# Patient Record
Sex: Female | Born: 1945
Health system: Southern US, Community
[De-identification: ages and names within clinical notes are randomized; demographics above are authoritative.]

## PROBLEM LIST (undated history)

## (undated) DIAGNOSIS — I471 Supraventricular tachycardia, unspecified: Secondary | ICD-10-CM

## (undated) DIAGNOSIS — K219 Gastro-esophageal reflux disease without esophagitis: Secondary | ICD-10-CM

## (undated) DIAGNOSIS — J302 Other seasonal allergic rhinitis: Secondary | ICD-10-CM

## (undated) DIAGNOSIS — R079 Chest pain, unspecified: Secondary | ICD-10-CM

## (undated) DIAGNOSIS — R002 Palpitations: Secondary | ICD-10-CM

## (undated) DIAGNOSIS — E785 Hyperlipidemia, unspecified: Secondary | ICD-10-CM

## (undated) DIAGNOSIS — I10 Essential (primary) hypertension: Secondary | ICD-10-CM

## (undated) DIAGNOSIS — E119 Type 2 diabetes mellitus without complications: Secondary | ICD-10-CM

## (undated) DIAGNOSIS — E669 Obesity, unspecified: Secondary | ICD-10-CM

## (undated) DIAGNOSIS — Z8679 Personal history of other diseases of the circulatory system: Secondary | ICD-10-CM

## (undated) DIAGNOSIS — M8580 Other specified disorders of bone density and structure, unspecified site: Secondary | ICD-10-CM

## (undated) HISTORY — DX: Hyperlipidemia, unspecified: E78.5

## (undated) HISTORY — DX: Palpitations: R00.2

## (undated) HISTORY — DX: Supraventricular tachycardia: I47.1

## (undated) HISTORY — DX: Type 2 diabetes mellitus without complications: E11.9

## (undated) HISTORY — DX: Obesity, unspecified: E66.9

## (undated) HISTORY — DX: Personal history of other diseases of the circulatory system: Z86.79

## (undated) HISTORY — DX: Supraventricular tachycardia, unspecified: I47.10

## (undated) HISTORY — DX: Essential (primary) hypertension: I10

## (undated) HISTORY — DX: Other specified disorders of bone density and structure, unspecified site: M85.80

## (undated) HISTORY — DX: Gastro-esophageal reflux disease without esophagitis: K21.9

## (undated) HISTORY — DX: Chest pain, unspecified: R07.9

---
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
## 1978-04-19 HISTORY — PX: TUBAL LIGATION: SHX77

## 1997-11-25 ENCOUNTER — Other Ambulatory Visit: Admission: RE | Admit: 1997-11-25 | Discharge: 1997-11-25 | Payer: Self-pay | Admitting: *Deleted

## 1998-12-30 ENCOUNTER — Other Ambulatory Visit: Admission: RE | Admit: 1998-12-30 | Discharge: 1998-12-30 | Payer: Self-pay | Admitting: *Deleted

## 2000-02-22 ENCOUNTER — Other Ambulatory Visit: Admission: RE | Admit: 2000-02-22 | Discharge: 2000-02-22 | Payer: Self-pay | Admitting: Obstetrics & Gynecology

## 2000-02-23 DIAGNOSIS — Z8679 Personal history of other diseases of the circulatory system: Secondary | ICD-10-CM

## 2000-02-23 HISTORY — DX: Personal history of other diseases of the circulatory system: Z86.79

## 2000-03-21 ENCOUNTER — Encounter: Admission: RE | Admit: 2000-03-21 | Discharge: 2000-06-19 | Payer: Self-pay | Admitting: Pulmonary Disease

## 2000-11-16 ENCOUNTER — Emergency Department (HOSPITAL_COMMUNITY): Admission: EM | Admit: 2000-11-16 | Discharge: 2000-11-16 | Payer: Self-pay | Admitting: Emergency Medicine

## 2001-01-30 ENCOUNTER — Emergency Department (HOSPITAL_COMMUNITY): Admission: EM | Admit: 2001-01-30 | Discharge: 2001-01-30 | Payer: Self-pay | Admitting: Emergency Medicine

## 2001-02-24 ENCOUNTER — Other Ambulatory Visit: Admission: RE | Admit: 2001-02-24 | Discharge: 2001-02-24 | Payer: Self-pay | Admitting: Obstetrics & Gynecology

## 2001-04-19 HISTORY — PX: CATARACT EXTRACTION: SUR2

## 2001-08-12 ENCOUNTER — Emergency Department (HOSPITAL_COMMUNITY): Admission: EM | Admit: 2001-08-12 | Discharge: 2001-08-13 | Payer: Self-pay | Admitting: *Deleted

## 2002-02-28 ENCOUNTER — Other Ambulatory Visit: Admission: RE | Admit: 2002-02-28 | Discharge: 2002-02-28 | Payer: Self-pay | Admitting: Obstetrics & Gynecology

## 2002-11-06 ENCOUNTER — Encounter: Payer: Self-pay | Admitting: Emergency Medicine

## 2002-11-06 ENCOUNTER — Emergency Department (HOSPITAL_COMMUNITY): Admission: EM | Admit: 2002-11-06 | Discharge: 2002-11-06 | Payer: Self-pay | Admitting: Emergency Medicine

## 2003-02-06 ENCOUNTER — Emergency Department (HOSPITAL_COMMUNITY): Admission: EM | Admit: 2003-02-06 | Discharge: 2003-02-06 | Payer: Self-pay | Admitting: Emergency Medicine

## 2003-02-06 ENCOUNTER — Encounter: Payer: Self-pay | Admitting: Emergency Medicine

## 2003-03-04 ENCOUNTER — Other Ambulatory Visit: Admission: RE | Admit: 2003-03-04 | Discharge: 2003-03-04 | Payer: Self-pay | Admitting: Obstetrics & Gynecology

## 2003-08-12 ENCOUNTER — Emergency Department (HOSPITAL_COMMUNITY): Admission: EM | Admit: 2003-08-12 | Discharge: 2003-08-12 | Payer: Self-pay | Admitting: *Deleted

## 2003-08-28 ENCOUNTER — Ambulatory Visit (HOSPITAL_COMMUNITY): Admission: RE | Admit: 2003-08-28 | Discharge: 2003-08-28 | Payer: Self-pay | Admitting: Urology

## 2004-10-01 ENCOUNTER — Encounter (INDEPENDENT_AMBULATORY_CARE_PROVIDER_SITE_OTHER): Payer: Self-pay | Admitting: Internal Medicine

## 2004-12-05 ENCOUNTER — Emergency Department (HOSPITAL_COMMUNITY): Admission: EM | Admit: 2004-12-05 | Discharge: 2004-12-05 | Payer: Self-pay | Admitting: *Deleted

## 2005-05-20 ENCOUNTER — Encounter (INDEPENDENT_AMBULATORY_CARE_PROVIDER_SITE_OTHER): Payer: Self-pay | Admitting: Internal Medicine

## 2005-05-20 LAB — CONVERTED CEMR LAB: Pap Smear: NORMAL

## 2005-09-14 ENCOUNTER — Ambulatory Visit: Payer: Self-pay | Admitting: Internal Medicine

## 2005-09-15 ENCOUNTER — Ambulatory Visit (HOSPITAL_COMMUNITY): Admission: RE | Admit: 2005-09-15 | Discharge: 2005-09-15 | Payer: Self-pay | Admitting: Internal Medicine

## 2005-09-15 ENCOUNTER — Encounter (INDEPENDENT_AMBULATORY_CARE_PROVIDER_SITE_OTHER): Payer: Self-pay | Admitting: Internal Medicine

## 2005-09-23 ENCOUNTER — Ambulatory Visit: Payer: Self-pay | Admitting: Internal Medicine

## 2005-10-05 ENCOUNTER — Encounter (INDEPENDENT_AMBULATORY_CARE_PROVIDER_SITE_OTHER): Payer: Self-pay | Admitting: Internal Medicine

## 2005-10-11 ENCOUNTER — Ambulatory Visit: Payer: Self-pay | Admitting: Internal Medicine

## 2005-10-12 ENCOUNTER — Encounter (INDEPENDENT_AMBULATORY_CARE_PROVIDER_SITE_OTHER): Payer: Self-pay | Admitting: Internal Medicine

## 2005-11-12 ENCOUNTER — Encounter (INDEPENDENT_AMBULATORY_CARE_PROVIDER_SITE_OTHER): Payer: Self-pay | Admitting: Internal Medicine

## 2006-01-03 ENCOUNTER — Ambulatory Visit: Payer: Self-pay | Admitting: Internal Medicine

## 2006-04-04 ENCOUNTER — Encounter (INDEPENDENT_AMBULATORY_CARE_PROVIDER_SITE_OTHER): Payer: Self-pay | Admitting: Internal Medicine

## 2006-04-05 ENCOUNTER — Encounter (INDEPENDENT_AMBULATORY_CARE_PROVIDER_SITE_OTHER): Payer: Self-pay | Admitting: Internal Medicine

## 2006-04-23 ENCOUNTER — Encounter: Payer: Self-pay | Admitting: Internal Medicine

## 2006-04-23 DIAGNOSIS — F329 Major depressive disorder, single episode, unspecified: Secondary | ICD-10-CM

## 2006-04-23 DIAGNOSIS — H269 Unspecified cataract: Secondary | ICD-10-CM

## 2006-04-23 DIAGNOSIS — Z8679 Personal history of other diseases of the circulatory system: Secondary | ICD-10-CM | POA: Insufficient documentation

## 2006-04-23 DIAGNOSIS — K5900 Constipation, unspecified: Secondary | ICD-10-CM | POA: Insufficient documentation

## 2006-04-23 DIAGNOSIS — K219 Gastro-esophageal reflux disease without esophagitis: Secondary | ICD-10-CM

## 2006-04-23 DIAGNOSIS — D649 Anemia, unspecified: Secondary | ICD-10-CM

## 2006-04-23 DIAGNOSIS — E782 Mixed hyperlipidemia: Secondary | ICD-10-CM

## 2006-04-23 DIAGNOSIS — M1990 Unspecified osteoarthritis, unspecified site: Secondary | ICD-10-CM | POA: Insufficient documentation

## 2006-04-23 DIAGNOSIS — E041 Nontoxic single thyroid nodule: Secondary | ICD-10-CM | POA: Insufficient documentation

## 2006-04-23 DIAGNOSIS — F3289 Other specified depressive episodes: Secondary | ICD-10-CM | POA: Insufficient documentation

## 2006-05-16 ENCOUNTER — Encounter (INDEPENDENT_AMBULATORY_CARE_PROVIDER_SITE_OTHER): Payer: Self-pay | Admitting: Internal Medicine

## 2006-05-17 ENCOUNTER — Telehealth (INDEPENDENT_AMBULATORY_CARE_PROVIDER_SITE_OTHER): Payer: Self-pay | Admitting: Internal Medicine

## 2006-05-18 ENCOUNTER — Encounter (INDEPENDENT_AMBULATORY_CARE_PROVIDER_SITE_OTHER): Payer: Self-pay | Admitting: Internal Medicine

## 2006-05-19 ENCOUNTER — Ambulatory Visit: Payer: Self-pay | Admitting: Internal Medicine

## 2006-05-20 LAB — CONVERTED CEMR LAB: Pap Smear: NORMAL

## 2006-05-27 ENCOUNTER — Ambulatory Visit: Payer: Self-pay | Admitting: Internal Medicine

## 2006-05-27 DIAGNOSIS — J019 Acute sinusitis, unspecified: Secondary | ICD-10-CM

## 2006-09-07 ENCOUNTER — Encounter (INDEPENDENT_AMBULATORY_CARE_PROVIDER_SITE_OTHER): Payer: Self-pay | Admitting: Internal Medicine

## 2006-09-08 ENCOUNTER — Telehealth (INDEPENDENT_AMBULATORY_CARE_PROVIDER_SITE_OTHER): Payer: Self-pay | Admitting: Internal Medicine

## 2006-09-09 ENCOUNTER — Encounter (INDEPENDENT_AMBULATORY_CARE_PROVIDER_SITE_OTHER): Payer: Self-pay | Admitting: Internal Medicine

## 2006-09-20 ENCOUNTER — Encounter (INDEPENDENT_AMBULATORY_CARE_PROVIDER_SITE_OTHER): Payer: Self-pay | Admitting: Internal Medicine

## 2006-09-22 ENCOUNTER — Encounter (INDEPENDENT_AMBULATORY_CARE_PROVIDER_SITE_OTHER): Payer: Self-pay | Admitting: Internal Medicine

## 2006-09-22 LAB — CONVERTED CEMR LAB
ALT: 20 units/L
Alkaline Phosphatase: 75 units/L
CO2: 25 meq/L
Calcium: 9.6 mg/dL
Chloride: 105 meq/L
Cholesterol: 135 mg/dL
HDL: 59 mg/dL
Potassium: 5.2 meq/L
Total Bilirubin: 0.5 mg/dL
Total CHOL/HDL Ratio: 2.3
Triglycerides: 155 mg/dL
VLDL: 31 mg/dL

## 2006-09-26 ENCOUNTER — Ambulatory Visit: Payer: Self-pay | Admitting: Internal Medicine

## 2006-09-26 DIAGNOSIS — I10 Essential (primary) hypertension: Secondary | ICD-10-CM

## 2006-09-26 LAB — CONVERTED CEMR LAB: Hgb A1c MFr Bld: 7.3 %

## 2006-09-27 ENCOUNTER — Encounter (INDEPENDENT_AMBULATORY_CARE_PROVIDER_SITE_OTHER): Payer: Self-pay | Admitting: Internal Medicine

## 2006-10-17 ENCOUNTER — Encounter (INDEPENDENT_AMBULATORY_CARE_PROVIDER_SITE_OTHER): Payer: Self-pay | Admitting: Internal Medicine

## 2006-12-14 ENCOUNTER — Telehealth (INDEPENDENT_AMBULATORY_CARE_PROVIDER_SITE_OTHER): Payer: Self-pay | Admitting: *Deleted

## 2006-12-26 ENCOUNTER — Encounter (INDEPENDENT_AMBULATORY_CARE_PROVIDER_SITE_OTHER): Payer: Self-pay | Admitting: Internal Medicine

## 2006-12-26 ENCOUNTER — Ambulatory Visit: Payer: Self-pay | Admitting: Family Medicine

## 2006-12-26 DIAGNOSIS — R0989 Other specified symptoms and signs involving the circulatory and respiratory systems: Secondary | ICD-10-CM

## 2006-12-26 LAB — CONVERTED CEMR LAB
Glucose, 2 hr postprandial: 162 mg/dL
Hgb A1c MFr Bld: 7.1 %

## 2007-02-14 ENCOUNTER — Ambulatory Visit: Payer: Self-pay | Admitting: Internal Medicine

## 2007-03-30 ENCOUNTER — Ambulatory Visit: Payer: Self-pay | Admitting: Internal Medicine

## 2007-03-30 LAB — CONVERTED CEMR LAB: Blood Glucose, Fingerstick: 277

## 2007-04-26 ENCOUNTER — Encounter (INDEPENDENT_AMBULATORY_CARE_PROVIDER_SITE_OTHER): Payer: Self-pay | Admitting: Internal Medicine

## 2007-05-22 LAB — CONVERTED CEMR LAB: Pap Smear: NORMAL

## 2007-06-23 ENCOUNTER — Encounter (INDEPENDENT_AMBULATORY_CARE_PROVIDER_SITE_OTHER): Payer: Self-pay | Admitting: Internal Medicine

## 2007-06-27 LAB — CONVERTED CEMR LAB
BUN: 22 mg/dL (ref 6–23)
Chloride: 102 meq/L (ref 96–112)
Cholesterol: 187 mg/dL (ref 0–200)
Glucose, Bld: 176 mg/dL — ABNORMAL HIGH (ref 70–99)
HDL: 67 mg/dL (ref >39)
Total CHOL/HDL Ratio: 2.8
Total Protein: 7.2 g/dL (ref 6.0–8.3)
Triglycerides: 241 mg/dL — ABNORMAL HIGH (ref <150)
VLDL: 48 mg/dL — ABNORMAL HIGH (ref 0–40)

## 2007-07-06 ENCOUNTER — Ambulatory Visit: Payer: Self-pay | Admitting: Internal Medicine

## 2007-07-06 DIAGNOSIS — L608 Other nail disorders: Secondary | ICD-10-CM | POA: Insufficient documentation

## 2007-07-06 DIAGNOSIS — E559 Vitamin D deficiency, unspecified: Secondary | ICD-10-CM | POA: Insufficient documentation

## 2007-07-06 LAB — CONVERTED CEMR LAB
Blood Glucose, Fingerstick: 333
Hgb A1c MFr Bld: 7.8 %

## 2007-07-07 ENCOUNTER — Telehealth (INDEPENDENT_AMBULATORY_CARE_PROVIDER_SITE_OTHER): Payer: Self-pay | Admitting: *Deleted

## 2007-07-07 ENCOUNTER — Encounter (INDEPENDENT_AMBULATORY_CARE_PROVIDER_SITE_OTHER): Payer: Self-pay | Admitting: Internal Medicine

## 2007-07-07 LAB — CONVERTED CEMR LAB
Creatinine, Urine: 74.1 mg/dL
Microalb, Ur: 0.56 mg/dL (ref 0.00–1.89)

## 2007-08-08 ENCOUNTER — Telehealth (INDEPENDENT_AMBULATORY_CARE_PROVIDER_SITE_OTHER): Payer: Self-pay | Admitting: *Deleted

## 2007-08-14 ENCOUNTER — Telehealth (INDEPENDENT_AMBULATORY_CARE_PROVIDER_SITE_OTHER): Payer: Self-pay | Admitting: Internal Medicine

## 2007-08-14 ENCOUNTER — Encounter (INDEPENDENT_AMBULATORY_CARE_PROVIDER_SITE_OTHER): Payer: Self-pay | Admitting: Internal Medicine

## 2007-09-20 ENCOUNTER — Encounter (INDEPENDENT_AMBULATORY_CARE_PROVIDER_SITE_OTHER): Payer: Self-pay | Admitting: Internal Medicine

## 2007-10-06 ENCOUNTER — Ambulatory Visit: Payer: Self-pay | Admitting: Internal Medicine

## 2007-10-06 DIAGNOSIS — E669 Obesity, unspecified: Secondary | ICD-10-CM

## 2008-01-05 ENCOUNTER — Ambulatory Visit: Payer: Self-pay | Admitting: Internal Medicine

## 2008-01-05 DIAGNOSIS — E669 Obesity, unspecified: Secondary | ICD-10-CM

## 2008-01-05 DIAGNOSIS — E1169 Type 2 diabetes mellitus with other specified complication: Secondary | ICD-10-CM | POA: Insufficient documentation

## 2008-02-02 ENCOUNTER — Ambulatory Visit: Payer: Self-pay | Admitting: Internal Medicine

## 2008-03-11 ENCOUNTER — Encounter (INDEPENDENT_AMBULATORY_CARE_PROVIDER_SITE_OTHER): Payer: Self-pay | Admitting: Internal Medicine

## 2008-03-12 ENCOUNTER — Encounter (INDEPENDENT_AMBULATORY_CARE_PROVIDER_SITE_OTHER): Payer: Self-pay | Admitting: Internal Medicine

## 2008-03-13 LAB — CONVERTED CEMR LAB
Albumin: 4.3 g/dL (ref 3.5–5.2)
Basophils Absolute: 0.1 10*3/uL (ref 0.0–0.1)
Basophils Relative: 1 % (ref 0–1)
Calcium: 9.7 mg/dL (ref 8.4–10.5)
Chloride: 104 meq/L (ref 96–112)
Cholesterol: 189 mg/dL (ref 0–200)
Creatinine, Ser: 0.75 mg/dL (ref 0.40–1.20)
Eosinophils Absolute: 0.2 10*3/uL (ref 0.0–0.7)
Eosinophils Relative: 4 % (ref 0–5)
Glucose, Bld: 142 mg/dL — ABNORMAL HIGH (ref 70–99)
Lymphocytes Relative: 44 % (ref 12–46)
MCHC: 30.7 g/dL (ref 30.0–36.0)
MCV: 92.8 fL (ref 78.0–100.0)
Monocytes Relative: 8 % (ref 3–12)
Potassium: 4.7 meq/L (ref 3.5–5.3)
Sodium: 141 meq/L (ref 135–145)
TSH: 2.072 microintl units/mL (ref 0.350–4.50)
Total Bilirubin: 0.5 mg/dL (ref 0.3–1.2)
VLDL: 36 mg/dL (ref 0–40)
Vit D, 1,25-Dihydroxy: 39 (ref 30–89)

## 2008-04-04 ENCOUNTER — Ambulatory Visit: Payer: Self-pay | Admitting: Internal Medicine

## 2008-04-23 ENCOUNTER — Encounter (INDEPENDENT_AMBULATORY_CARE_PROVIDER_SITE_OTHER): Payer: Self-pay | Admitting: Internal Medicine

## 2008-05-02 ENCOUNTER — Encounter (INDEPENDENT_AMBULATORY_CARE_PROVIDER_SITE_OTHER): Payer: Self-pay | Admitting: Internal Medicine

## 2008-11-22 ENCOUNTER — Ambulatory Visit: Payer: Self-pay | Admitting: Internal Medicine

## 2008-11-22 DIAGNOSIS — R5381 Other malaise: Secondary | ICD-10-CM

## 2008-11-22 DIAGNOSIS — R5383 Other fatigue: Secondary | ICD-10-CM

## 2008-11-22 LAB — CONVERTED CEMR LAB
Creatinine, Urine: 276.7 mg/dL
Hgb A1c MFr Bld: 7.4 %
Microalb Creat Ratio: 30.1 mg/g — ABNORMAL HIGH (ref 0.0–30.0)

## 2008-12-06 ENCOUNTER — Ambulatory Visit: Payer: Self-pay | Admitting: Internal Medicine

## 2008-12-13 ENCOUNTER — Encounter (INDEPENDENT_AMBULATORY_CARE_PROVIDER_SITE_OTHER): Payer: Self-pay | Admitting: Internal Medicine

## 2008-12-17 ENCOUNTER — Encounter (INDEPENDENT_AMBULATORY_CARE_PROVIDER_SITE_OTHER): Payer: Self-pay | Admitting: Internal Medicine

## 2008-12-26 ENCOUNTER — Encounter (INDEPENDENT_AMBULATORY_CARE_PROVIDER_SITE_OTHER): Payer: Self-pay | Admitting: Internal Medicine

## 2009-01-01 ENCOUNTER — Ambulatory Visit: Payer: Self-pay | Admitting: Internal Medicine

## 2010-05-17 LAB — CONVERTED CEMR LAB
Basophils Absolute: 0 10*3/uL
CO2: 20 meq/L
Calcium: 9.8 mg/dL
Chloride: 106 meq/L
Cholesterol: 165 mg/dL
Eosinophils Relative: 4 %
HCT: 39.5 %
Hemoglobin: 12.6 g/dL
LDL Cholesterol: 72 mg/dL
MCHC: 31.9 g/dL
MCV: 92.3 fL
Microalb Creat Ratio: 7.1 mg/g
Microalb, Ur: 0.73 mg/dL
Potassium: 4.4 meq/L
RBC count: 4.28 10*6/uL
Sodium: 138 meq/L
TSH: 2.118 microintl units/mL
Total CHOL/HDL Ratio: 2.8
Triglycerides: 173 mg/dL

## 2010-08-19 ENCOUNTER — Emergency Department (HOSPITAL_COMMUNITY)
Admission: EM | Admit: 2010-08-19 | Discharge: 2010-08-19 | Disposition: A | Discharge status: Home / Self Care | Payer: BC Managed Care – PPO | Attending: Emergency Medicine | Admitting: Emergency Medicine

## 2010-08-19 ENCOUNTER — Emergency Department (HOSPITAL_COMMUNITY): Payer: BC Managed Care – PPO

## 2010-08-19 ENCOUNTER — Inpatient Hospital Stay (HOSPITAL_COMMUNITY): Admit: 2010-08-19 | Payer: Self-pay | Source: Other Acute Inpatient Hospital | Admitting: Cardiovascular Disease

## 2010-08-19 DIAGNOSIS — R079 Chest pain, unspecified: Secondary | ICD-10-CM | POA: Insufficient documentation

## 2010-08-19 DIAGNOSIS — I10 Essential (primary) hypertension: Secondary | ICD-10-CM | POA: Insufficient documentation

## 2010-08-19 DIAGNOSIS — Z7982 Long term (current) use of aspirin: Secondary | ICD-10-CM | POA: Insufficient documentation

## 2010-08-19 DIAGNOSIS — Z79899 Other long term (current) drug therapy: Secondary | ICD-10-CM | POA: Insufficient documentation

## 2010-08-19 DIAGNOSIS — E119 Type 2 diabetes mellitus without complications: Secondary | ICD-10-CM | POA: Insufficient documentation

## 2010-08-19 LAB — BASIC METABOLIC PANEL
Calcium: 10.1 mg/dL (ref 8.4–10.5)
GFR calc Af Amer: >60 mL/min (ref >60)
GFR calc non Af Amer: >60 mL/min (ref >60)

## 2010-08-19 LAB — CBC
HCT: 37.8 % (ref 36.0–46.0)
Hemoglobin: 12.3 g/dL (ref 12.0–15.0)
MCH: 29.6 pg (ref 26.0–34.0)
MCV: 90.9 fL (ref 78.0–100.0)
Platelets: 255 10*3/uL (ref 150–400)
RDW: 13.1 % (ref 11.5–15.5)
WBC: 7.8 10*3/uL (ref 4.0–10.5)

## 2010-08-19 LAB — DIFFERENTIAL
Basophils Relative: 1 % (ref 0–1)
Lymphs Abs: 3.8 10*3/uL (ref 0.7–4.0)
Monocytes Absolute: 0.5 10*3/uL (ref 0.1–1.0)
Monocytes Relative: 6 % (ref 3–12)
Neutro Abs: 3.2 10*3/uL (ref 1.7–7.7)
Neutrophils Relative %: 41 % — ABNORMAL LOW (ref 43–77)

## 2010-08-19 LAB — POCT CARDIAC MARKERS
Myoglobin, poc: 36.9 ng/mL (ref 12–200)
Troponin i, poc: <0.05 ng/mL (ref 0.00–0.09)

## 2010-09-16 DIAGNOSIS — R079 Chest pain, unspecified: Secondary | ICD-10-CM

## 2010-09-16 HISTORY — DX: Chest pain, unspecified: R07.9

## 2012-02-17 ENCOUNTER — Other Ambulatory Visit (HOSPITAL_COMMUNITY): Payer: BC Managed Care – PPO

## 2012-02-24 ENCOUNTER — Ambulatory Visit: Admit: 2012-02-24 | Payer: Self-pay | Admitting: Ophthalmology

## 2012-02-24 SURGERY — PHACOEMULSIFICATION, CATARACT, WITH IOL INSERTION
Anesthesia: Monitor Anesthesia Care | Site: Eye | Laterality: Left

## 2012-07-12 ENCOUNTER — Encounter: Payer: Self-pay | Admitting: *Deleted

## 2012-08-26 ENCOUNTER — Encounter: Payer: Self-pay | Admitting: Cardiovascular Disease

## 2012-08-30 ENCOUNTER — Encounter: Payer: Self-pay | Admitting: Cardiovascular Disease

## 2012-08-30 ENCOUNTER — Ambulatory Visit (INDEPENDENT_AMBULATORY_CARE_PROVIDER_SITE_OTHER): Payer: Medicare Other | Admitting: Cardiovascular Disease

## 2012-08-30 VITALS — BP 110/68 | HR 65 | Resp 16 | Ht 63.0 in | Wt 198.0 lb

## 2012-08-30 DIAGNOSIS — I10 Essential (primary) hypertension: Secondary | ICD-10-CM

## 2012-08-30 DIAGNOSIS — E669 Obesity, unspecified: Secondary | ICD-10-CM

## 2012-08-30 DIAGNOSIS — E785 Hyperlipidemia, unspecified: Secondary | ICD-10-CM

## 2012-08-30 DIAGNOSIS — Z8679 Personal history of other diseases of the circulatory system: Secondary | ICD-10-CM

## 2012-08-30 DIAGNOSIS — E119 Type 2 diabetes mellitus without complications: Secondary | ICD-10-CM

## 2012-08-30 NOTE — Progress Notes (Signed)
Patient ID: Brenda Hatfield, female   DOB: 1945-12-02, 67 y.o.   MRN: [id number]   HPI  Brenda Hatfield is a 67 year old woman with a history of obesity diabetes mellitus type 2 systemic hypertension hyperlipidemia who returns for followup. Previous workup including echocardiography and stress testing has not shown any evidence of structural heart disease. She underwent an echo perfusion scan in 2012 that was normal.  She has made a serious efforts at weight loss and improve her diet and has managed to lose 18 pounds in weight. She is now back to the weight that she had been 2012.  She has not yet really started a program of rigorous physical activity because the winter was so cold, but plans to do so  No Known Allergies  Current Outpatient Prescriptions  Medication Sig Dispense Refill  . aspirin 81 MG tablet Take 81 mg by mouth daily.      . calcium carbonate (OS-CAL) 600 MG TABS Take 600 mg by mouth daily.      [name] diltiazem (CARDIZEM CD) 180 MG 24 hr capsule Take 180 mg by mouth daily.      . insulin aspart (NOVOLOG) 100 UNIT/ML injection Inject into the skin 3 (three) times daily before meals. FlexPen per Sliding Scale      . insulin glargine (LANTUS) 100 UNIT/ML injection Inject 44 Units into the skin at bedtime.      [name] lisinopril (PRINIVIL,ZESTRIL) 10 MG tablet Take 10 mg by mouth daily.      . metFORMIN (GLUCOPHAGE) 1000 MG tablet Take 1,000 mg by mouth 2 (two) times daily with a meal.      . metoprolol succinate (TOPROL-XL) 25 MG 24 hr tablet Take 25 mg by mouth daily.      [name] omeprazole (PRILOSEC) 20 MG capsule Take 20 mg by mouth daily.      . rosuvastatin (CRESTOR) 20 MG tablet Take 20 mg by mouth daily.       No current facility-administered medications for this visit.    Past Medical History  Diagnosis Date  . Obesity   . Diabetes mellitus     type 2  . Systemic hypertension   . Hyperlipemia   . Chest pain 09/16/2010    normal myocardial perfusion study EF=61%  . History of  cardiac murmur 02/23/00    2D Echo EF greater than 55%  . SVT (supraventricular tachycardia)   . Palpitation   . GERD (gastroesophageal reflux disease)   . CAD (coronary artery disease)     Past Surgical History  Procedure Laterality Date  . Tonsillectomy  1957  . Tubal ligation  1980    Family History  Problem Relation Age of Onset  . Parkinson's disease Father   . Diabetes Father     History   Social History  . Marital Status: Married    Spouse Name: N/A    Number of Children: N/A  . Years of Education: N/A   Occupational History  . Not on file.   Social History Main Topics  . Smoking status: Never Smoker   . Smokeless tobacco: Not on file  . Alcohol Use: No  . Drug Use: No  . Sexually Active: Not on file   Other Topics Concern  . Not on file   Social History Narrative  . No narrative on file    ROS: ,The patient specifically denies any chest pain at rest exertion, dyspnea at rest or with exertion, orthopnea, paroxysmal nocturnal dyspnea, syncope, palpitations, focal neurological deficits,  intermittent claudication, lower showed edema, unexplained weight gain, cough, hemoptysis or wheezing.   PHYSICAL EXAM BP 110/68  Pulse 65  Resp 16  Ht 5\' 3"  (1.6 m)  Wt 198 lb (89.812 kg)  BMI 35.08 kg/m2  General: Alert, oriented x3, no distress Head: no evidence of trauma, PERRL, EOMI, no exophtalmos or lid lag, no myxedema, no xanthelasma; normal ears, nose and oropharynx Neck: normal jugular venous pulsations and no hepatojugular reflux; brisk carotid pulses without delay and no carotid bruits Chest: clear to auscultation, no signs of consolidation by percussion or palpation, normal fremitus, symmetrical and full respiratory excursions Cardiovascular: normal position and quality of the apical impulse, regular rhythm, normal first and second heart sounds, no murmurs, rubs or gallops Abdomen: no tenderness or distention, no masses by palpation, no abnormal  pulsatility or arterial bruits, normal bowel sounds, no hepatosplenomegaly Extremities: no clubbing, cyanosis or edema; 2+ radial, ulnar and brachial pulses bilaterally; 2+ right femoral, posterior tibial and dorsalis pedis pulses; 2+ left femoral, posterior tibial and dorsalis pedis pulsesno subclavian or femoral bruits Neurological: grossly nonfocal   EKG: normal sinus rhythm., normal.  Her most recent hemoglobin A1c was reportedly 7.8% and her triglycerides were still little high but her cholesterol and LDL cholesterol were within the desirable range.  ASSESSMENT AND PLAN:  Mrs. Joost has actually made considerable progress with weight loss. This is reflected in improved diabetes control and once appropriate glycemic control is achieved I think her triglycerides will also be in the desirable range. We discussed the fact that physical activity is a necessary part of her metabolic improvement and will greatly assist with weight loss. She has noticed that she has reached a "plateau" and her weight loss attempts.  All in all I congratulated Mrs. Garfield for a lot of progress made within the last 12 months. No changes are recommended to her current medications.  Cc: Catalina Pizza, MD  [place] Fair

## 2013-04-03 ENCOUNTER — Encounter (HOSPITAL_COMMUNITY): Payer: Self-pay | Admitting: Pharmacy Technician

## 2013-04-04 ENCOUNTER — Encounter (HOSPITAL_COMMUNITY)
Admission: RE | Admit: 2013-04-04 | Discharge: 2013-04-04 | Disposition: A | Discharge status: Home / Self Care | Payer: Medicare Other | Source: Ambulatory Visit | Attending: Ophthalmology | Admitting: Ophthalmology

## 2013-04-04 ENCOUNTER — Encounter (HOSPITAL_COMMUNITY): Payer: Self-pay

## 2013-04-04 DIAGNOSIS — Z01812 Encounter for preprocedural laboratory examination: Secondary | ICD-10-CM | POA: Diagnosis present

## 2013-04-04 LAB — BASIC METABOLIC PANEL
CO2: 26 mEq/L (ref 19–32)
Calcium: 9.9 mg/dL (ref 8.4–10.5)
GFR calc non Af Amer: 86 mL/min — ABNORMAL LOW (ref >90)
Sodium: 139 mEq/L (ref 135–145)

## 2013-04-04 LAB — HEMOGLOBIN AND HEMATOCRIT, BLOOD: HCT: 41.9 % (ref 36.0–46.0)

## 2013-04-04 NOTE — Patient Instructions (Addendum)
Your procedure is scheduled on: 04/09/2013  Report to [facility] at  900 AM    Call this number if you have problems the morning of surgery: [phone]   Do not eat food or drink liquids :After Midnight.      Take these medicines the morning of surgery with A SIP OF WATER: lisinopril, metoprolol, prilosec, diltiazem   Do not wear jewelry, make-up or nail polish.  Do not wear lotions, powders, or perfumes.   Do not shave 48 hours prior to surgery.  Do not bring valuables to the hospital.  Contacts, dentures or bridgework may not be worn into surgery.  Leave suitcase in the car. After surgery it may be brought to your room.  For patients admitted to the hospital, checkout time is 11:00 AM the day of discharge.   Patients discharged the day of surgery will not be allowed to drive home.  :     Please read over the following fact sheets that you were given: Coughing and Deep Breathing, Surgical Site Infection Prevention, Anesthesia Post-op Instructions and Care and Recovery After Surgery    Cataract A cataract is a clouding of the lens of the eye. When a lens becomes cloudy, vision is reduced based on the degree and nature of the clouding. Many cataracts reduce vision to some degree. Some cataracts make people more near-sighted as they develop. Other cataracts increase glare. Cataracts that are ignored and become worse can sometimes look white. The white color can be seen through the pupil. CAUSES   Aging. However, cataracts may occur at any age, even in newborns.   Certain drugs.   Trauma to the eye.   Certain diseases such as diabetes.   Specific eye diseases such as chronic inflammation inside the eye or a sudden attack of a rare form of glaucoma.   Inherited or acquired medical problems.  SYMPTOMS   Gradual, progressive drop in vision in the affected eye.   Severe, rapid visual loss. This most often happens when trauma is the cause.  DIAGNOSIS  To detect a cataract, an eye  doctor examines the lens. Cataracts are best diagnosed with an exam of the eyes with the pupils enlarged (dilated) by drops.  TREATMENT  For an early cataract, vision may improve by using different eyeglasses or stronger lighting. If that does not help your vision, surgery is the only effective treatment. A cataract needs to be surgically removed when vision loss interferes with your everyday activities, such as driving, reading, or watching TV. A cataract may also have to be removed if it prevents examination or treatment of another eye problem. Surgery removes the cloudy lens and usually replaces it with a substitute lens (intraocular lens, IOL).  At a time when both you and your doctor agree, the cataract will be surgically removed. If you have cataracts in both eyes, only one is usually removed at a time. This allows the operated eye to heal and be out of danger from any possible problems after surgery (such as infection or poor wound healing). In rare cases, a cataract may be doing damage to your eye. In these cases, your caregiver may advise surgical removal right away. The vast majority of people who have cataract surgery have better vision afterward. HOME CARE INSTRUCTIONS  If you are not planning surgery, you may be asked to do the following:  Use different eyeglasses.   Use stronger or brighter lighting.   Ask your eye doctor about reducing your medicine dose  or changing medicines if it is thought that a medicine caused your cataract. Changing medicines does not make the cataract go away on its own.   Become familiar with your surroundings. Poor vision can lead to injury. Avoid bumping into things on the affected side. You are at a higher risk for tripping or falling.   Exercise extreme care when driving or operating machinery.   Wear sunglasses if you are sensitive to bright light or experiencing problems with glare.  SEEK IMMEDIATE MEDICAL CARE IF:   You have a worsening or sudden  vision loss.   You notice redness, swelling, or increasing pain in the eye.   You have a fever.  Document Released: 04/05/2005 Document Revised: 03/25/2011 Document Reviewed: 11/27/2010 [facility] Patient Information 2012 [place].PATIENT INSTRUCTIONS POST-ANESTHESIA  IMMEDIATELY FOLLOWING SURGERY:  Do not drive or operate machinery for the first twenty four hours after surgery.  Do not make any important decisions for twenty four hours after surgery or while taking narcotic pain medications or sedatives.  If you develop intractable nausea and vomiting or a severe headache please notify your doctor immediately.  FOLLOW-UP:  Please make an appointment with your surgeon as instructed. You do not need to follow up with anesthesia unless specifically instructed to do so.  WOUND CARE INSTRUCTIONS (if applicable):  Keep a dry clean dressing on the anesthesia/puncture wound site if there is drainage.  Once the wound has quit draining you may leave it open to air.  Generally you should leave the bandage intact for twenty four hours unless there is drainage.  If the epidural site drains for more than 36-48 hours please call the anesthesia department.  QUESTIONS?:  Please feel free to call your physician or the hospital operator if you have any questions, and they will be happy to assist you.

## 2013-04-09 ENCOUNTER — Encounter (HOSPITAL_COMMUNITY)
Admission: RE | Disposition: A | Discharge status: Home / Self Care | Payer: Self-pay | Source: Ambulatory Visit | Attending: Ophthalmology

## 2013-04-09 ENCOUNTER — Encounter (HOSPITAL_COMMUNITY): Payer: Self-pay | Admitting: Ophthalmology

## 2013-04-09 ENCOUNTER — Encounter (HOSPITAL_COMMUNITY): Payer: Medicare Other | Admitting: Anesthesiology

## 2013-04-09 ENCOUNTER — Ambulatory Visit (HOSPITAL_COMMUNITY): Payer: Medicare Other | Admitting: Anesthesiology

## 2013-04-09 ENCOUNTER — Ambulatory Visit (HOSPITAL_COMMUNITY)
Admission: RE | Admit: 2013-04-09 | Discharge: 2013-04-09 | Disposition: A | Discharge status: Home / Self Care | Payer: Medicare Other | Source: Ambulatory Visit | Attending: Ophthalmology | Admitting: Ophthalmology

## 2013-04-09 DIAGNOSIS — Z79899 Other long term (current) drug therapy: Secondary | ICD-10-CM | POA: Insufficient documentation

## 2013-04-09 DIAGNOSIS — Z794 Long term (current) use of insulin: Secondary | ICD-10-CM | POA: Insufficient documentation

## 2013-04-09 DIAGNOSIS — Z01812 Encounter for preprocedural laboratory examination: Secondary | ICD-10-CM | POA: Insufficient documentation

## 2013-04-09 DIAGNOSIS — I10 Essential (primary) hypertension: Secondary | ICD-10-CM | POA: Insufficient documentation

## 2013-04-09 DIAGNOSIS — H2589 Other age-related cataract: Secondary | ICD-10-CM | POA: Insufficient documentation

## 2013-04-09 DIAGNOSIS — E119 Type 2 diabetes mellitus without complications: Secondary | ICD-10-CM | POA: Insufficient documentation

## 2013-04-09 HISTORY — PX: CATARACT EXTRACTION W/PHACO: SHX586

## 2013-04-09 LAB — GLUCOSE, CAPILLARY: Glucose-Capillary: 88 mg/dL (ref 70–99)

## 2013-04-09 SURGERY — PHACOEMULSIFICATION, CATARACT, WITH IOL INSERTION
Anesthesia: Monitor Anesthesia Care | Site: Eye | Laterality: Left

## 2013-04-09 MED ORDER — FENTANYL CITRATE 0.05 MG/ML IJ SOLN
25.0000 ug | INTRAMUSCULAR | Status: AC
  Administered 2013-04-09: 25 ug via INTRAVENOUS

## 2013-04-09 MED ORDER — EPINEPHRINE HCL 1 MG/ML IJ SOLN
INTRAOCULAR | Status: DC | PRN
  Administered 2013-04-09: 10:00:00

## 2013-04-09 MED ORDER — LACTATED RINGERS IV SOLN
INTRAVENOUS | Status: DC
  Administered 2013-04-09: 10:00:00 via INTRAVENOUS

## 2013-04-09 MED ORDER — KETOROLAC TROMETHAMINE 0.5 % OP SOLN: 1.0000 [drp] | OPHTHALMIC | Status: AC

## 2013-04-09 MED ORDER — TETRACAINE HCL 0.5 % OP SOLN
1.0000 [drp] | OPHTHALMIC | Status: AC
  Administered 2013-04-09 (×3): 1 [drp] via OPHTHALMIC

## 2013-04-09 MED ORDER — NEOMYCIN-POLYMYXIN-DEXAMETH 3.5-10000-0.1 OP SUSP
OPHTHALMIC | Status: DC | PRN
  Administered 2013-04-09: 2 [drp] via OPHTHALMIC

## 2013-04-09 MED ORDER — FENTANYL CITRATE 0.05 MG/ML IJ SOLN: 25.0000 ug | INTRAMUSCULAR | Status: DC | PRN

## 2013-04-09 MED ORDER — FENTANYL CITRATE 0.05 MG/ML IJ SOLN
INTRAMUSCULAR | Status: DC | PRN
  Administered 2013-04-09: 25 ug via INTRAVENOUS

## 2013-04-09 MED ORDER — LIDOCAINE HCL 3.5 % OP GEL
1.0000 "application " | Freq: Once | OPHTHALMIC | Status: AC
  Administered 2013-04-09: 1 via OPHTHALMIC

## 2013-04-09 MED ORDER — FENTANYL CITRATE 0.05 MG/ML IJ SOLN
INTRAMUSCULAR | Status: AC
  Filled 2013-04-09: qty 2

## 2013-04-09 MED ORDER — ONDANSETRON HCL 4 MG/2ML IJ SOLN: 4.0000 mg | Freq: Once | INTRAMUSCULAR | Status: DC | PRN

## 2013-04-09 MED ORDER — MIDAZOLAM HCL 2 MG/2ML IJ SOLN
INTRAMUSCULAR | Status: AC
  Filled 2013-04-09: qty 2

## 2013-04-09 MED ORDER — CYCLOPENTOLATE-PHENYLEPHRINE 0.2-1 % OP SOLN
1.0000 [drp] | OPHTHALMIC | Status: AC
  Administered 2013-04-09 (×3): 1 [drp] via OPHTHALMIC

## 2013-04-09 MED ORDER — FENTANYL CITRATE 0.05 MG/ML IJ SOLN: INTRAMUSCULAR | Status: DC | PRN

## 2013-04-09 MED ORDER — PROVISC 10 MG/ML IO SOLN
INTRAOCULAR | Status: DC | PRN
  Administered 2013-04-09: 0.85 mL via INTRAOCULAR

## 2013-04-09 MED ORDER — BSS IO SOLN
INTRAOCULAR | Status: DC | PRN
  Administered 2013-04-09: 15 mL via INTRAOCULAR

## 2013-04-09 MED ORDER — LIDOCAINE HCL (PF) 1 % IJ SOLN
INTRAMUSCULAR | Status: DC | PRN
  Administered 2013-04-09: .4 mL

## 2013-04-09 MED ORDER — POVIDONE-IODINE 5 % OP SOLN
OPHTHALMIC | Status: DC | PRN
  Administered 2013-04-09: 1 via OPHTHALMIC

## 2013-04-09 MED ORDER — MIDAZOLAM HCL 2 MG/2ML IJ SOLN
1.0000 mg | INTRAMUSCULAR | Status: DC | PRN
  Administered 2013-04-09: 2 mg via INTRAVENOUS

## 2013-04-09 MED ORDER — PHENYLEPHRINE HCL 2.5 % OP SOLN
1.0000 [drp] | OPHTHALMIC | Status: AC
  Administered 2013-04-09 (×3): 1 [drp] via OPHTHALMIC

## 2013-04-09 SURGICAL SUPPLY — 32 items

## 2013-04-09 NOTE — Anesthesia Procedure Notes (Signed)
Procedure Name: MAC Date/Time: 04/09/2013 10:22 AM Performed by: Franco Nones Pre-anesthesia Checklist: Patient identified, Emergency Drugs available, Suction available, Timeout performed and Patient being monitored Patient Re-evaluated:Patient Re-evaluated prior to inductionOxygen Delivery Method: Nasal Cannula

## 2013-04-09 NOTE — Anesthesia Postprocedure Evaluation (Signed)
  Anesthesia Post-op Note  Patient: Brenda Hatfield  Procedure(s) Performed: Procedure(s) (LRB): LEFT EYE CATARACT EXTRACTION PHACO AND INTRAOCULAR LENS PLACEMENT  (Left)  Patient Location:  Short Stay  Anesthesia Type: MAC  Level of Consciousness: awake  Airway and Oxygen Therapy: Patient Spontanous Breathing  Post-op Pain: none  Post-op Assessment: Post-op Vital signs reviewed, Patient's Cardiovascular Status Stable, Respiratory Function Stable, Patent Airway, No signs of Nausea or vomiting and Pain level controlled  Post-op Vital Signs: Reviewed and stable  Complications: No apparent anesthesia complications

## 2013-04-09 NOTE — Anesthesia Preprocedure Evaluation (Addendum)
Anesthesia Evaluation  Patient identified by MRN, date of birth, ID band Patient awake    Reviewed: Allergy & Precautions, H&P , NPO status , Patient's Chart, lab work & pertinent test results  Airway Mallampati: II TM Distance: >3 FB     Dental   Pulmonary neg pulmonary ROS,  breath sounds clear to auscultation        Cardiovascular hypertension, Pt. on medications + CAD + dysrhythmias Supra Ventricular Tachycardia Rhythm:Regular Rate:Normal     Neuro/Psych PSYCHIATRIC DISORDERS Depression    GI/Hepatic   Endo/Other  diabetes, Type 2, Oral Hypoglycemic Agents and Insulin Dependent  Renal/GU      Musculoskeletal   Abdominal   Peds  Hematology   Anesthesia Other Findings   Reproductive/Obstetrics                          Anesthesia Physical Anesthesia Plan  ASA: III  Anesthesia Plan: MAC   Post-op Pain Management:    Induction: Intravenous  Airway Management Planned: Nasal Cannula  Additional Equipment:   Intra-op Plan:   Post-operative Plan:   Informed Consent: I have reviewed the patients History and Physical, chart, labs and discussed the procedure including the risks, benefits and alternatives for the proposed anesthesia with the patient or authorized representative who has indicated his/her understanding and acceptance.     Plan Discussed with:   Anesthesia Plan Comments:         Anesthesia Quick Evaluation

## 2013-04-09 NOTE — H&P (Signed)
I have reviewed the H&P, the patient was re-examined, and I have identified no interval changes in medical condition and plan of care since the history and physical of record  

## 2013-04-09 NOTE — Op Note (Signed)
Date of Admission: 04/09/2013  Date of Surgery: 04/09/2013  Pre-Op Dx: Cataract  Left  Eye  Post-Op Dx: Cataract  Left  Eye,  Dx Code 366.19  Surgeon: Gemma Payor, M.D.  Assistants: None  Anesthesia: Topical with MAC  Indications: Painless, progressive loss of vision with compromise of daily activities.  Surgery: Cataract Extraction with Intraocular lens Implant Left Eye  Discription: The patient had dilating drops and viscous lidocaine placed into the left eye in the pre-op holding area. After transfer to the operating room, a time out was performed. The patient was then prepped and draped. Beginning with a 75 degree blade a paracentesis port was made at the surgeon's 2 o'clock position. The anterior chamber was then filled with 1% non-preserved lidocaine. This was followed by filling the anterior chamber with Provisc. A 2.106mm keratome blade was used to make a clear corneal incision at the temporal limbus. A bent cystatome needle was used to create a continuous tear capsulotomy. Hydrodissection was performed with balanced salt solution on a Fine canula. The lens nucleus was then removed using the phacoemulsification handpiece. Residual cortex was removed with the I&A handpiece. The anterior chamber and capsular bag were refilled with Provisc. A posterior chamber intraocular lens was placed into the capsular bag with it's injector. The implant was positioned with the Kuglan hook. The Provisc was then removed from the anterior chamber and capsular bag with the I&A handpiece. Stromal hydration of the main incision and paracentesis port was performed with BSS on a Fine canula. The wounds were tested for leak which was negative. The patient tolerated the procedure well. There were no operative complications. The patient was then transferred to the recovery room in stable condition.  Complications: None  Specimen: None  EBL: None  Prosthetic device: B&L enVista, MX60, power 17.5D, SN  [id number].

## 2013-04-09 NOTE — Transfer of Care (Signed)
Immediate Anesthesia Transfer of Care Note  Patient: Brenda Hatfield  Procedure(s) Performed: Procedure(s) (LRB): LEFT EYE CATARACT EXTRACTION PHACO AND INTRAOCULAR LENS PLACEMENT  (Left)  Patient Location: Shortstay  Anesthesia Type: MAC  Level of Consciousness: awake  Airway & Oxygen Therapy: Patient Spontanous Breathing   Post-op Assessment: Report given to PACU RN, Post -op Vital signs reviewed and stable and Patient moving all extremities  Post vital signs: Reviewed and stable  Complications: No apparent anesthesia complications

## 2013-04-10 ENCOUNTER — Encounter (HOSPITAL_COMMUNITY): Payer: Self-pay | Admitting: Ophthalmology

## 2015-05-12 DIAGNOSIS — Z01419 Encounter for gynecological examination (general) (routine) without abnormal findings: Secondary | ICD-10-CM | POA: Diagnosis not present

## 2015-05-12 DIAGNOSIS — Z1231 Encounter for screening mammogram for malignant neoplasm of breast: Secondary | ICD-10-CM | POA: Diagnosis not present

## 2015-05-14 DIAGNOSIS — E119 Type 2 diabetes mellitus without complications: Secondary | ICD-10-CM | POA: Diagnosis not present

## 2015-05-28 DIAGNOSIS — E782 Mixed hyperlipidemia: Secondary | ICD-10-CM | POA: Diagnosis not present

## 2015-05-28 DIAGNOSIS — I10 Essential (primary) hypertension: Secondary | ICD-10-CM | POA: Diagnosis not present

## 2015-05-28 DIAGNOSIS — E119 Type 2 diabetes mellitus without complications: Secondary | ICD-10-CM | POA: Diagnosis not present

## 2015-06-02 DIAGNOSIS — E782 Mixed hyperlipidemia: Secondary | ICD-10-CM | POA: Diagnosis not present

## 2015-06-02 DIAGNOSIS — E119 Type 2 diabetes mellitus without complications: Secondary | ICD-10-CM | POA: Diagnosis not present

## 2015-06-02 DIAGNOSIS — I10 Essential (primary) hypertension: Secondary | ICD-10-CM | POA: Diagnosis not present

## 2015-09-30 DIAGNOSIS — E119 Type 2 diabetes mellitus without complications: Secondary | ICD-10-CM | POA: Diagnosis not present

## 2015-09-30 DIAGNOSIS — E782 Mixed hyperlipidemia: Secondary | ICD-10-CM | POA: Diagnosis not present

## 2015-10-02 DIAGNOSIS — I10 Essential (primary) hypertension: Secondary | ICD-10-CM | POA: Diagnosis not present

## 2015-10-02 DIAGNOSIS — E782 Mixed hyperlipidemia: Secondary | ICD-10-CM | POA: Diagnosis not present

## 2015-10-02 DIAGNOSIS — E119 Type 2 diabetes mellitus without complications: Secondary | ICD-10-CM | POA: Diagnosis not present

## 2015-11-19 DIAGNOSIS — E119 Type 2 diabetes mellitus without complications: Secondary | ICD-10-CM | POA: Diagnosis not present

## 2015-11-26 DIAGNOSIS — Z9841 Cataract extraction status, right eye: Secondary | ICD-10-CM | POA: Diagnosis not present

## 2015-11-26 DIAGNOSIS — E11319 Type 2 diabetes mellitus with unspecified diabetic retinopathy without macular edema: Secondary | ICD-10-CM | POA: Diagnosis not present

## 2015-11-26 DIAGNOSIS — H43811 Vitreous degeneration, right eye: Secondary | ICD-10-CM | POA: Diagnosis not present

## 2015-11-26 DIAGNOSIS — Z961 Presence of intraocular lens: Secondary | ICD-10-CM | POA: Diagnosis not present

## 2015-12-10 ENCOUNTER — Ambulatory Visit (INDEPENDENT_AMBULATORY_CARE_PROVIDER_SITE_OTHER): Payer: PPO | Admitting: Cardiovascular Disease

## 2015-12-10 ENCOUNTER — Encounter: Payer: Self-pay | Admitting: Cardiovascular Disease

## 2015-12-10 VITALS — BP 100/68 | HR 70 | Ht 63.0 in | Wt 198.2 lb

## 2015-12-10 DIAGNOSIS — E669 Obesity, unspecified: Secondary | ICD-10-CM

## 2015-12-10 DIAGNOSIS — E785 Hyperlipidemia, unspecified: Secondary | ICD-10-CM

## 2015-12-10 DIAGNOSIS — I471 Supraventricular tachycardia: Secondary | ICD-10-CM

## 2015-12-10 DIAGNOSIS — IMO0001 Reserved for inherently not codable concepts without codable children: Secondary | ICD-10-CM

## 2015-12-10 DIAGNOSIS — E119 Type 2 diabetes mellitus without complications: Secondary | ICD-10-CM

## 2015-12-10 DIAGNOSIS — E1169 Type 2 diabetes mellitus with other specified complication: Secondary | ICD-10-CM

## 2015-12-10 DIAGNOSIS — I10 Essential (primary) hypertension: Secondary | ICD-10-CM

## 2015-12-10 NOTE — Patient Instructions (Signed)
Dr Sallyanne Kuster has recommended making the following medication changes: 1. STOP Diltiazem  Dr C recommends that you schedule a follow-up appointment in 1 year. You will receive a reminder letter in the mail two months in advance. If you don't receive a letter, please call our office to schedule the follow-up appointment.  If you need a refill on your cardiac medications before your next appointment, please call your pharmacy.

## 2015-12-10 NOTE — Progress Notes (Signed)
Cardiology Office Note    Date:  12/12/2015   ID:  Brenda Hatfield, DOB March 05, 1946, MRN [id number]  PCP:  Brenda Neighbors, MD  Cardiologist:   Brenda Klein, MD   Chief Complaint  Patient presents with  . New Evaluation    pt to re establish    History of Present Illness:  Brenda Hatfield is a 70 y.o. female returns for her first follow-up visit since May 2014. She has a history of systemic hypertension, type 2 diabetes mellitus, dyslipidemia, obesity, supraventricular tachycardia and a cardiac murmur.  A nuclear stress test in 2012 showed breast attenuation artifact, but was otherwise normal. An echocardiogram in 2001 was a mostly normal study: Systolic function, [id number], very mild left ventricular hypertrophy. There was mild mitral annular calcification and trivial MR, aortic valve sclerosis without stenosis which likely explains cardiac murmur. The left atrium was mildly enlarged at 4.5 cm.  Has not lost any weight in the last 3 years but has not gained any either. She hasn't had palpitations in several years. She denies angina and exertional dyspnea. She was recently started on a new type of insulin and on Jardiance. Her last hemoglobin A1c was a little high at 7.3% Dr. Nevada Crane has checked her cholesterol and she believes the results were satisfactory.   Past Medical History:  Diagnosis Date  . CAD (coronary artery disease)   . Chest pain 09/16/2010   normal myocardial perfusion study EF=61%  . Diabetes mellitus ([place])    type 2  . GERD (gastroesophageal reflux disease)   . History of cardiac murmur 02/23/00   2D Echo EF greater than 55%  . Hyperlipemia   . Obesity   . Palpitation   . SVT (supraventricular tachycardia) ([place])   . Systemic hypertension     Past Surgical History:  Procedure Laterality Date  . CATARACT EXTRACTION Right 2003   [place]  . CATARACT EXTRACTION W/PHACO Left 04/09/2013   Procedure: LEFT EYE CATARACT EXTRACTION PHACO AND INTRAOCULAR LENS PLACEMENT ;   Surgeon: Tonny Branch, MD;  Location: [facility];  Service: Ophthalmology;  Laterality: Left;  CDE 10.78  . TONSILLECTOMY  1957  . TUBAL LIGATION  1980    Current Medications: Outpatient Medications Prior to Visit  Medication Sig Dispense Refill  . aspirin 81 MG tablet Take 81 mg by mouth daily.    [name] atorvastatin (LIPITOR) 40 MG tablet Take 40 mg by mouth daily.    [name] glucose 4 GM chewable tablet Chew 0.5 tablets by mouth as needed for low blood sugar.    . insulin aspart (NOVOLOG FLEXPEN) 100 UNIT/ML SOPN FlexPen Inject 6-8 Units into the skin 3 (three) times daily with meals.    [name] lisinopril (PRINIVIL,ZESTRIL) 10 MG tablet Take 10 mg by mouth daily.    . metFORMIN (GLUCOPHAGE) 1000 MG tablet Take 1,000 mg by mouth 2 (two) times daily with a meal.    . metoprolol succinate (TOPROL-XL) 25 MG 24 hr tablet Take 25 mg by mouth daily.    [name] omeprazole (PRILOSEC) 20 MG capsule Take 20 mg by mouth every other day.     . diltiazem (CARDIZEM CD) 180 MG 24 hr capsule Take 180 mg by mouth daily.    . calcium carbonate (OS-CAL) 600 MG TABS Take 600 mg by mouth daily.    . insulin glargine (LANTUS) 100 units/mL SOLN Inject 40 Units into the skin at bedtime.     No facility-administered medications prior to visit.  Allergies:   Review of patient's allergies indicates no known allergies.   Social History   Social History  . Marital status: Married    Spouse name: N/A  . Number of children: N/A  . Years of education: N/A   Social History Main Topics  . Smoking status: Never Smoker  . Smokeless tobacco: Not on file  . Alcohol use No  . Drug use: No  . Sexual activity: Yes    Birth control/ protection: None   Other Topics Concern  . Not on file   Social History Narrative  . No narrative on file     Family History:  The patient's family history includes Diabetes in her father; Parkinson's disease in her father.   ROS:   Please see the history of present illness.    ROS All other  systems reviewed and are negative.   PHYSICAL EXAM:   VS:  BP 100/68 (BP Location: Left Arm, Patient Position: Sitting, Cuff Size: Normal)   Pulse 70   Ht 5\' 3"  (1.6 m)   Wt 198 lb 3.2 oz (89.9 kg)   SpO2 99%   BMI 35.11 kg/m    GEN: Well nourished, well developed, in no acute distress  HEENT: normal  Neck: no JVD, carotid bruits, or masses Cardiac: RRR;2/6 early peaking aortic ejection murmur, no diastolic murmurs, rubs, or gallops,no edema  Respiratory:  clear to auscultation bilaterally, normal work of breathing GI: soft, nontender, nondistended, + BS MS: no deformity or atrophy  Skin: warm and dry, no rash Neuro:  Alert and Oriented x 3, Strength and sensation are intact Psych: euthymic mood, full affect  Wt Readings from Last 3 Encounters:  12/10/15 198 lb 3.2 oz (89.9 kg)  04/09/13 200 lb (90.7 kg)  04/04/13 201 lb (91.2 kg)      Studies/Labs Reviewed:   EKG:  EKG is ordered today.  The ekg ordered today demonstrates Sinus rhythm, poor R-wave progression, otherwise normal  Recent Labs: No results found for requested labs within last 8760 hours.   Lipid Panel    Component Value Date/Time   CHOL 189 03/11/2008 2045   TRIG 180 (H) 03/11/2008 2045   HDL 69 03/11/2008 2045   CHOLHDL 2.7 Ratio 03/11/2008 2045   VLDL 36 03/11/2008 2045   LDLCALC 84 03/11/2008 2045      ASSESSMENT:    1. Diabetes mellitus type 2 in obese ([place])   2. Essential hypertension   3. SVT (supraventricular tachycardia) ([place])   4. Hyperlipidemia   5. Obesity, Class II, BMI 35-39.9, with comorbidity (HCC)      PLAN:  In order of problems listed above:  1. DM: Control of glycemia is not quite optimal, but recent changes of been made to her diabetes regimen 2. HTN: Her blood pressure is actually quite low. I recommended that we stop the diltiazem and reevaluate 3. SVT: She has not really had any symptoms of tachycardia in over 10 years. She may do fine with beta blocker therapy  alone. 4. HLP: On statin, will get her most recent lab report from Dr. Nevada Crane 5. Obesity: She has reached a bit of a plateau with weight loss, encouraged her to try again to shed some pounds. If she is successful this may have positive impact on her tendency to have arrhythmia and complications of diabetes.    Medication Adjustments/Labs and Tests Ordered: Current medicines are reviewed at length with the patient today.  Concerns regarding medicines are outlined above.  Medication changes,  Labs and Tests ordered today are listed in the Patient Instructions below. Patient Instructions  Dr Sallyanne Kuster has recommended making the following medication changes: 1. STOP Diltiazem  Dr C recommends that you schedule a follow-up appointment in 1 year. You will receive a reminder letter in the mail two months in advance. If you don't receive a letter, please call our office to schedule the follow-up appointment.  If you need a refill on your cardiac medications before your next appointment, please call your pharmacy.    Signed, Brenda Klein, MD  12/12/2015 2:14 PM    [facility] [address], [place], [place]  [address] Phone: [phone]; Fax: [phone]

## 2015-12-12 ENCOUNTER — Encounter: Payer: Self-pay | Admitting: Cardiovascular Disease

## 2015-12-24 DIAGNOSIS — Z961 Presence of intraocular lens: Secondary | ICD-10-CM | POA: Diagnosis not present

## 2015-12-24 DIAGNOSIS — E119 Type 2 diabetes mellitus without complications: Secondary | ICD-10-CM | POA: Diagnosis not present

## 2015-12-24 DIAGNOSIS — H26492 Other secondary cataract, left eye: Secondary | ICD-10-CM | POA: Diagnosis not present

## 2016-01-26 DIAGNOSIS — Z23 Encounter for immunization: Secondary | ICD-10-CM | POA: Diagnosis not present

## 2016-02-11 DIAGNOSIS — I10 Essential (primary) hypertension: Secondary | ICD-10-CM | POA: Diagnosis not present

## 2016-02-11 DIAGNOSIS — E119 Type 2 diabetes mellitus without complications: Secondary | ICD-10-CM | POA: Diagnosis not present

## 2016-02-18 DIAGNOSIS — E782 Mixed hyperlipidemia: Secondary | ICD-10-CM | POA: Diagnosis not present

## 2016-02-18 DIAGNOSIS — Z6833 Body mass index (BMI) 33.0-33.9, adult: Secondary | ICD-10-CM | POA: Diagnosis not present

## 2016-02-18 DIAGNOSIS — Z23 Encounter for immunization: Secondary | ICD-10-CM | POA: Diagnosis not present

## 2016-02-18 DIAGNOSIS — E119 Type 2 diabetes mellitus without complications: Secondary | ICD-10-CM | POA: Diagnosis not present

## 2016-02-18 DIAGNOSIS — I10 Essential (primary) hypertension: Secondary | ICD-10-CM | POA: Diagnosis not present

## 2016-04-22 ENCOUNTER — Encounter: Payer: Self-pay | Admitting: Cardiovascular Disease

## 2016-04-22 ENCOUNTER — Telehealth: Payer: Self-pay | Admitting: Cardiovascular Disease

## 2016-04-22 NOTE — Telephone Encounter (Signed)
New Message  Pt voiced she needs clarification in her file with which she does not understand.  Please f/u with pt

## 2016-04-22 NOTE — Telephone Encounter (Signed)
i've let patient know, she was very appreciative. She did note this will need handwritten signature for legal purposes. I've printed off a copy for signature when you're in office next.

## 2016-04-22 NOTE — Telephone Encounter (Signed)
Applied for insurance policy to leave to her grandkids Report from [place] was "really good" but notes there was a box checked for Coronary Artery Disease Didn't understand bc nothing in records to indicate this on her history.  The agent setting up her policy informed her she will need letter from cardiologist indicating she has never been treated for this, or, clarification as to why it was marked in 1st place.  Stressed that letter needs a written signature from provider.  Finished letter can be sent to:   Gabriela Eves [address] [place] [address]  I've made the patient aware I will communicate the request to Dr. Sallyanne Kuster and follow up with her. She voiced understanding and thanks.

## 2016-04-22 NOTE — Telephone Encounter (Signed)
Letter composed and addressed to her PCP. Please send a copy to wherever she needs it to go.

## 2016-04-23 NOTE — Telephone Encounter (Signed)
Here all day, ...sadly

## 2016-04-23 NOTE — Telephone Encounter (Signed)
Letter signed and patient aware this is available for her to pick up.  She voiced thanks and acknowledgment. No further needs identified at this time.

## 2016-05-10 DIAGNOSIS — E782 Mixed hyperlipidemia: Secondary | ICD-10-CM | POA: Diagnosis not present

## 2016-05-10 DIAGNOSIS — E119 Type 2 diabetes mellitus without complications: Secondary | ICD-10-CM | POA: Diagnosis not present

## 2016-05-10 DIAGNOSIS — I10 Essential (primary) hypertension: Secondary | ICD-10-CM | POA: Diagnosis not present

## 2016-05-14 ENCOUNTER — Telehealth: Payer: Self-pay | Admitting: Cardiovascular Disease

## 2016-05-14 NOTE — Telephone Encounter (Signed)
New Message   Per pt medical clarification letter that was mailed never reached it's destination. Wants to see if they can Fax letter to [facility] @ [employer] [phone]

## 2016-05-14 NOTE — Telephone Encounter (Signed)
See telephone encounter from 04/22/2016 for further clarification. Letter printed and faxed to Clint's attention as requested.

## 2016-05-14 NOTE — Telephone Encounter (Signed)
Spoke to patient, she's aware this has been handled and sent to her rep at [employer]. She voiced thanks for quick f/u and is aware to call if further needs.

## 2016-05-26 DIAGNOSIS — E6609 Other obesity due to excess calories: Secondary | ICD-10-CM | POA: Diagnosis not present

## 2016-05-26 DIAGNOSIS — E782 Mixed hyperlipidemia: Secondary | ICD-10-CM | POA: Diagnosis not present

## 2016-05-26 DIAGNOSIS — Z6832 Body mass index (BMI) 32.0-32.9, adult: Secondary | ICD-10-CM | POA: Diagnosis not present

## 2016-05-26 DIAGNOSIS — E119 Type 2 diabetes mellitus without complications: Secondary | ICD-10-CM | POA: Diagnosis not present

## 2016-05-26 DIAGNOSIS — I10 Essential (primary) hypertension: Secondary | ICD-10-CM | POA: Diagnosis not present

## 2016-06-01 DIAGNOSIS — Z01419 Encounter for gynecological examination (general) (routine) without abnormal findings: Secondary | ICD-10-CM | POA: Diagnosis not present

## 2016-06-01 DIAGNOSIS — Z1231 Encounter for screening mammogram for malignant neoplasm of breast: Secondary | ICD-10-CM | POA: Diagnosis not present

## 2016-08-17 DIAGNOSIS — I10 Essential (primary) hypertension: Secondary | ICD-10-CM | POA: Diagnosis not present

## 2016-08-17 DIAGNOSIS — E782 Mixed hyperlipidemia: Secondary | ICD-10-CM | POA: Diagnosis not present

## 2016-08-17 DIAGNOSIS — E119 Type 2 diabetes mellitus without complications: Secondary | ICD-10-CM | POA: Diagnosis not present

## 2016-08-25 DIAGNOSIS — E782 Mixed hyperlipidemia: Secondary | ICD-10-CM | POA: Diagnosis not present

## 2016-08-25 DIAGNOSIS — Z6831 Body mass index (BMI) 31.0-31.9, adult: Secondary | ICD-10-CM | POA: Diagnosis not present

## 2016-08-25 DIAGNOSIS — E119 Type 2 diabetes mellitus without complications: Secondary | ICD-10-CM | POA: Diagnosis not present

## 2016-08-25 DIAGNOSIS — Z0000 Encounter for general adult medical examination without abnormal findings: Secondary | ICD-10-CM | POA: Diagnosis not present

## 2016-08-25 DIAGNOSIS — I10 Essential (primary) hypertension: Secondary | ICD-10-CM | POA: Diagnosis not present

## 2016-08-25 DIAGNOSIS — E6609 Other obesity due to excess calories: Secondary | ICD-10-CM | POA: Diagnosis not present

## 2016-08-25 DIAGNOSIS — Z713 Dietary counseling and surveillance: Secondary | ICD-10-CM | POA: Diagnosis not present

## 2016-09-27 ENCOUNTER — Encounter (HOSPITAL_COMMUNITY): Payer: Self-pay | Admitting: Emergency Medicine

## 2016-09-27 ENCOUNTER — Emergency Department (HOSPITAL_COMMUNITY): Payer: PPO

## 2016-09-27 ENCOUNTER — Emergency Department (HOSPITAL_COMMUNITY)
Admission: EM | Admit: 2016-09-27 | Discharge: 2016-09-27 | Discharge status: Against Medical Advice | Payer: PPO | Attending: Emergency Medicine | Admitting: Emergency Medicine

## 2016-09-27 DIAGNOSIS — I10 Essential (primary) hypertension: Secondary | ICD-10-CM | POA: Diagnosis not present

## 2016-09-27 DIAGNOSIS — Z794 Long term (current) use of insulin: Secondary | ICD-10-CM | POA: Insufficient documentation

## 2016-09-27 DIAGNOSIS — R55 Syncope and collapse: Secondary | ICD-10-CM | POA: Insufficient documentation

## 2016-09-27 DIAGNOSIS — E119 Type 2 diabetes mellitus without complications: Secondary | ICD-10-CM | POA: Insufficient documentation

## 2016-09-27 DIAGNOSIS — R404 Transient alteration of awareness: Secondary | ICD-10-CM | POA: Diagnosis not present

## 2016-09-27 DIAGNOSIS — Z79899 Other long term (current) drug therapy: Secondary | ICD-10-CM | POA: Insufficient documentation

## 2016-09-27 DIAGNOSIS — Z7982 Long term (current) use of aspirin: Secondary | ICD-10-CM | POA: Diagnosis not present

## 2016-09-27 DIAGNOSIS — N390 Urinary tract infection, site not specified: Secondary | ICD-10-CM | POA: Insufficient documentation

## 2016-09-27 DIAGNOSIS — R42 Dizziness and giddiness: Secondary | ICD-10-CM | POA: Diagnosis not present

## 2016-09-27 LAB — CBC WITH DIFFERENTIAL/PLATELET
BASOS ABS: 0 10*3/uL (ref 0.0–0.1)
Basophils Relative: 1 %
EOS ABS: 0.2 10*3/uL (ref 0.0–0.7)
EOS PCT: 3 %
HCT: 40.1 % (ref 36.0–46.0)
HEMOGLOBIN: 12.6 g/dL (ref 12.0–15.0)
LYMPHS ABS: 2.1 10*3/uL (ref 0.7–4.0)
LYMPHS PCT: 28 %
MCH: 29 pg (ref 26.0–34.0)
MCHC: 31.4 g/dL (ref 30.0–36.0)
MCV: 92.4 fL (ref 78.0–100.0)
Monocytes Absolute: 0.4 10*3/uL (ref 0.1–1.0)
Monocytes Relative: 5 %
NEUTROS PCT: 63 %
Neutro Abs: 4.7 10*3/uL (ref 1.7–7.7)
PLATELETS: 244 10*3/uL (ref 150–400)
RBC: 4.34 MIL/uL (ref 3.87–5.11)
RDW: 13.4 % (ref 11.5–15.5)
WBC: 7.5 10*3/uL (ref 4.0–10.5)

## 2016-09-27 LAB — COMPREHENSIVE METABOLIC PANEL
ALBUMIN: 3.8 g/dL (ref 3.5–5.0)
ALT: 28 U/L (ref 14–54)
ANION GAP: 10 (ref 5–15)
AST: 23 U/L (ref 15–41)
Alkaline Phosphatase: 89 U/L (ref 38–126)
BUN: 18 mg/dL (ref 6–20)
CO2: 25 mmol/L (ref 22–32)
Calcium: 9.1 mg/dL (ref 8.9–10.3)
Chloride: 103 mmol/L (ref 101–111)
Creatinine, Ser: 0.79 mg/dL (ref 0.44–1.00)
GFR calc non Af Amer: >60 mL/min (ref >60)
GLUCOSE: 145 mg/dL — AB (ref 65–99)
POTASSIUM: 4.2 mmol/L (ref 3.5–5.1)
SODIUM: 138 mmol/L (ref 135–145)
Total Bilirubin: 0.7 mg/dL (ref 0.3–1.2)
Total Protein: 7 g/dL (ref 6.5–8.1)

## 2016-09-27 LAB — TROPONIN I: Troponin I: <0.03 ng/mL (ref <0.03)

## 2016-09-27 LAB — URINALYSIS, ROUTINE W REFLEX MICROSCOPIC
Bilirubin Urine: NEGATIVE
HGB URINE DIPSTICK: NEGATIVE
KETONES UR: NEGATIVE mg/dL
Nitrite: POSITIVE — AB
PROTEIN: NEGATIVE mg/dL
Specific Gravity, Urine: 1.028 (ref 1.005–1.030)
pH: 5 (ref 5.0–8.0)

## 2016-09-27 LAB — LACTIC ACID, PLASMA: LACTIC ACID, VENOUS: 1.9 mmol/L (ref 0.5–1.9)

## 2016-09-27 LAB — CBG MONITORING, ED: Glucose-Capillary: 120 mg/dL — ABNORMAL HIGH (ref 65–99)

## 2016-09-27 MED ORDER — DEXTROSE 5 % IV SOLN: 1.0000 g | Freq: Once | INTRAVENOUS | Status: DC

## 2016-09-27 MED ORDER — CEPHALEXIN 500 MG PO CAPS: 500.0000 mg | ORAL_CAPSULE | Freq: Four times a day (QID) | ORAL | 0 refills | Status: DC

## 2016-09-27 MED ORDER — LIDOCAINE HCL (PF) 1 % IJ SOLN
INTRAMUSCULAR | Status: AC
  Filled 2016-09-27: qty 5

## 2016-09-27 MED ORDER — CEFTRIAXONE SODIUM 1 G IJ SOLR
1.0000 g | Freq: Once | INTRAMUSCULAR | Status: AC
  Administered 2016-09-27: 1 g via INTRAMUSCULAR
  Filled 2016-09-27: qty 10

## 2016-09-27 NOTE — ED Provider Notes (Signed)
[facility] DEPT Provider Note   CSN: [id number] Arrival date & time: 09/27/16  1419     History   Chief Complaint Chief Complaint  Patient presents with  . Loss of Consciousness    HPI Brenda Hatfield is a 70 y.o. female.  HPI  Pt was seen at 1440. Per pt, c/o sudden onset and resolution of one episode of syncope that occurred PTA. Pt states she was sitting getting her nails done when she felt lightheaded, followed by syncopal episode. EMS states pt was pale and diaphoretic on their arrival to scene. Pt states she took 2 of her own glucose tablets "just in case it was my sugar." Pt states she "just feels tired now." Pt states these symptoms have occurred previously, but she did not seek medical attention.  Denies any other prodromal symptoms before syncope. Denies CP/palpitations, no SOB/cough, no abd pain, no N/V/D, no reported seizure activity, no focal motor weakness, no tingling/numbness in extremities.    Past Medical History:  Diagnosis Date  . Chest pain 09/16/2010   normal myocardial perfusion study EF=61%  . Diabetes mellitus ([place])    type 2  . GERD (gastroesophageal reflux disease)   . History of cardiac murmur 02/23/00   2D Echo EF greater than 55%  . Hyperlipemia   . Obesity   . Palpitation   . SVT (supraventricular tachycardia) ([place])   . Systemic hypertension     Patient Active Problem List   Diagnosis Date Noted  . WEAKNESS 11/22/2008  . Diabetes mellitus type 2 in obese ([place]) 01/05/2008  . Obesity, Class II, BMI 35-39.9, with comorbidity ([place]) 10/06/2007  . VITAMIN D DEFICIENCY 07/06/2007  . ONYCHOLYSIS 07/06/2007  . SYMPTOMS INVOLVING CARDIOVASCULAR SYSTEM NEC 12/26/2006  . Essential hypertension 09/26/2006  . SINUSITIS, ACUTE 05/27/2006  . CYST, THYROID 04/23/2006  . Hyperlipidemia 04/23/2006  . ANEMIA-NOS 04/23/2006  . DEPRESSION 04/23/2006  . CATARACT NOS 04/23/2006  . GERD 04/23/2006  . CONSTIPATION NOS 04/23/2006  . OSTEOARTHRITIS  04/23/2006  . ARRHYTHMIA, HX OF 04/23/2006    Past Surgical History:  Procedure Laterality Date  . CATARACT EXTRACTION Right 2003   [place]  . CATARACT EXTRACTION W/PHACO Left 04/09/2013   Procedure: LEFT EYE CATARACT EXTRACTION PHACO AND INTRAOCULAR LENS PLACEMENT ;  Surgeon: Tonny Branch, MD;  Location: [facility];  Service: Ophthalmology;  Laterality: Left;  CDE 10.78  . TONSILLECTOMY  1957  . TUBAL LIGATION  1980    OB History    No data available       Home Medications    Prior to Admission medications   Medication Sig Start Date End Date Taking? Authorizing Provider  aspirin 81 MG tablet Take 81 mg by mouth daily.   Yes [provider]  atorvastatin (LIPITOR) 40 MG tablet Take 40 mg by mouth daily.   Yes [provider]  empagliflozin (JARDIANCE) 10 MG TABS tablet Take 10 mg by mouth daily.   Yes [provider]  glucose 4 GM chewable tablet Chew 0.5 tablets by mouth as needed for low blood sugar.   Yes [provider]  insulin aspart (NOVOLOG FLEXPEN) 100 UNIT/ML SOPN FlexPen Inject 6-8 Units into the skin 3 (three) times daily with meals.   Yes [provider]  lisinopril (PRINIVIL,ZESTRIL) 10 MG tablet Take 10 mg by mouth daily.   Yes [provider]  metFORMIN (GLUCOPHAGE) 1000 MG tablet Take 1,000 mg by mouth 2 (two) times daily with a meal.   Yes [provider]  metoprolol succinate (TOPROL-XL) 25 MG 24 hr tablet Take 25 mg by mouth daily.   Yes [provider]  omeprazole (PRILOSEC) 20 MG capsule Take 20 mg by mouth every other day.    Yes [provider]    Family History Family History  Problem Relation Age of Onset  . Parkinson's disease Father   . Diabetes Father     Social History Social History  Substance Use Topics  . Smoking status: Never Smoker  . Smokeless tobacco: Not on file  . Alcohol use No     Allergies   Patient has no known allergies.   Review of  Systems Review of Systems ROS: Statement: All systems negative except as marked or noted in the HPI; Constitutional: Negative for fever and chills. ; ; Eyes: Negative for eye pain, redness and discharge. ; ; ENMT: Negative for ear pain, hoarseness, nasal congestion, sinus pressure and sore throat. ; ; Cardiovascular: Negative for chest pain, palpitations, diaphoresis, dyspnea and peripheral edema. ; ; Respiratory: Negative for cough, wheezing and stridor. ; ; Gastrointestinal: Negative for nausea, vomiting, diarrhea, abdominal pain, blood in stool, hematemesis, jaundice and rectal bleeding. . ; ; Genitourinary: Negative for dysuria, flank pain and hematuria. ; ; Musculoskeletal: Negative for back pain and neck pain. Negative for swelling and trauma.; ; Skin: Negative for pruritus, rash, abrasions, blisters, bruising and skin lesion.; ; Neuro: Negative for headache and neck stiffness. Negative for weakness, extremity weakness, paresthesias, involuntary movement, seizure and +lightheadedness, +syncope.       Physical Exam Updated Vital Signs BP 112/65 (BP Location: Left Arm)   Pulse 65   Temp 98.4 F (36.9 C) (Oral)   Resp 20   Ht 5\' 3"  (1.6 m)   Wt 90.7 kg (200 lb)   SpO2 96%   BMI 35.43 kg/m    Patient Vitals for the past 24 hrs:  BP Temp Temp src Pulse Resp SpO2 Height Weight  09/27/16 1557 119/64 - - 73 18 97 % - -  09/27/16 1556 110/62 - - 71 20 98 % - -  09/27/16 1555 (!) 95/59 - - 69 18 98 % - -  09/27/16 1430 110/71 98.4 F (36.9 C) Oral 63 20 95 % - -  09/27/16 1421 - - - - - - 5\' 3"  (1.6 m) 90.7 kg (200 lb)    15:55:32 Vital Signs CH  Vital Signs  Pulse Rate: 69  Pulse Rate Source: Monitor  Bedside Cardiac Monitor On: Yes  Resp: 18  BP:  95/59  BP Location: Left Arm  BP Method: Automatic  Patient Position (if appropriate): Lying      15:55:32 Vital Signs CH  Vitals Assessment  Automatic Restart Vitals Timer: Yes  15:56:33 Vital Signs CH  Vital Signs  Pulse Rate:  71  Pulse Rate Source: Monitor  Bedside Cardiac Monitor On: Yes  Resp: 20  BP: 110/62  BP Location: Left Arm  BP Method: Automatic  Patient Position (if appropriate): Sitting      15:56:33 Vital Signs CH  Vitals Assessment  Automatic Restart Vitals Timer: Yes  15:57:27 Vital Signs CH  Vital Signs  Pulse Rate: 73  Pulse Rate Source: Monitor  Bedside Cardiac Monitor On: Yes  Resp: 18  BP: 119/64  BP Location: Left Arm  BP Method: Automatic  Patient Position (if appropriate): Standing      Oxygen Therapy  SpO2: 97 %  O2 Device: Room Air      Physical Exam 1445:  Physical examination:  Nursing notes reviewed; Vital signs and O2 SAT reviewed;  Constitutional: Well developed, Well nourished, Well hydrated, In no acute distress; Head:  Normocephalic, atraumatic; Eyes: EOMI, PERRL, No scleral icterus; ENMT: Mouth and pharynx normal, Mucous membranes moist; Neck: Supple, Full range of motion, No lymphadenopathy; Cardiovascular: Regular rate and rhythm, No gallop; Respiratory: Breath sounds clear & equal bilaterally, No wheezes.  Speaking full sentences with ease, Normal respiratory effort/excursion; Chest: Nontender, Movement normal; Abdomen: Soft, Nontender, Nondistended, Normal bowel sounds; Genitourinary: No CVA tenderness; Extremities: Pulses normal, No tenderness, No edema, No calf edema or asymmetry.; Neuro: AA&Ox3, Major CN grossly intact. No facial droop.  Speech clear. Grips equal. Strength 5/5 equal bilat UE's and LE's. No gross focal motor or sensory deficits in extremities.; Skin: Color normal, Warm, Dry.   ED Treatments / Results  Labs (all labs ordered are listed, but only abnormal results are displayed)   EKG  EKG Interpretation  Date/Time:  Monday September 27 2016 14:27:59 EDT Ventricular Rate:  65 PR Interval:    QRS Duration: 92 QT Interval:  434 QTC Calculation: 452 R Axis:   19 Text Interpretation:  Sinus rhythm When compared with ECG of 08/19/2010 No  significant change was found Confirmed by [facility]  MD, Nunzio Cory [phone]) on 09/27/2016 2:36:48 PM       Radiology   Procedures Procedures (including critical care time)  Medications Ordered in ED Medications - No data to display   Initial Impression / Assessment and Plan / ED Course  I have reviewed the triage vital signs and the nursing notes.  Pertinent labs & imaging results that were available during my care of the patient were reviewed by me and considered in my medical decision making (see chart for details).  MDM Reviewed: previous chart, nursing note and vitals Reviewed previous: labs and ECG Interpretation: labs, ECG, x-ray and CT scan   Results for orders placed or performed during the hospital encounter of 09/27/16  Comprehensive metabolic panel  Result Value Ref Range   Sodium 138 135 - 145 mmol/L   Potassium 4.2 3.5 - 5.1 mmol/L   Chloride 103 101 - 111 mmol/L   CO2 25 22 - 32 mmol/L   Glucose, Bld 145 (H) 65 - 99 mg/dL   BUN 18 6 - 20 mg/dL   Creatinine, Ser 0.79 0.44 - 1.00 mg/dL   Calcium 9.1 8.9 - 10.3 mg/dL   Total Protein 7.0 6.5 - 8.1 g/dL   Albumin 3.8 3.5 - 5.0 g/dL   AST 23 15 - 41 U/L   ALT 28 14 - 54 U/L   Alkaline Phosphatase 89 38 - 126 U/L   Total Bilirubin 0.7 0.3 - 1.2 mg/dL   GFR calc non Af Amer >60 >60 mL/min   GFR calc Af Amer >60 >60 mL/min   Anion gap 10 5 - 15  Troponin I  Result Value Ref Range   Troponin I <0.03 <0.03 ng/mL  Lactic acid, plasma  Result Value Ref Range   Lactic Acid, Venous 1.9 0.5 - 1.9 mmol/L  CBC with Differential  Result Value Ref Range   WBC 7.5 4.0 - 10.5 K/uL   RBC 4.34 3.87 - 5.11 MIL/uL   Hemoglobin 12.6 12.0 - 15.0 g/dL   HCT 40.1 36.0 - 46.0 %   MCV 92.4 78.0 - 100.0 fL   MCH 29.0 26.0 - 34.0 pg   MCHC 31.4 30.0 - 36.0 g/dL   RDW 13.4 11.5 - 15.5 %  Platelets 244 150 - 400 K/uL   Neutrophils Relative % 63 %   Neutro Abs 4.7 1.7 - 7.7 K/uL   Lymphocytes Relative 28 %   Lymphs Abs 2.1 0.7  - 4.0 K/uL   Monocytes Relative 5 %   Monocytes Absolute 0.4 0.1 - 1.0 K/uL   Eosinophils Relative 3 %   Eosinophils Absolute 0.2 0.0 - 0.7 K/uL   Basophils Relative 1 %   Basophils Absolute 0.0 0.0 - 0.1 K/uL  Urinalysis, Routine w reflex microscopic  Result Value Ref Range   Color, Urine YELLOW YELLOW   APPearance HAZY (A) CLEAR   Specific Gravity, Urine 1.028 1.005 - 1.030   pH 5.0 5.0 - 8.0   Glucose, UA >=500 (A) NEGATIVE mg/dL   Hgb urine dipstick NEGATIVE NEGATIVE   Bilirubin Urine NEGATIVE NEGATIVE   Ketones, ur NEGATIVE NEGATIVE mg/dL   Protein, ur NEGATIVE NEGATIVE mg/dL   Nitrite POSITIVE (A) NEGATIVE   Leukocytes, UA SMALL (A) NEGATIVE   RBC / HPF 0-5 0 - 5 RBC/hpf   WBC, UA 6-30 0 - 5 WBC/hpf   Bacteria, UA FEW (A) NONE SEEN   Squamous Epithelial / LPF 6-30 (A) NONE SEEN  CBG monitoring, ED  Result Value Ref Range   Glucose-Capillary 120 (H) 65 - 99 mg/dL   Dg Chest 2 View Result Date: 09/27/2016 CLINICAL DATA:  Syncope EXAM: CHEST  2 VIEW COMPARISON:  Aug 19, 2010 FINDINGS: Lungs are clear. Heart size and pulmonary vascularity are normal. No adenopathy. No bone lesions. IMPRESSION: No edema or consolidation. Electronically Signed   By: Lowella Grip III M.D.   On: 09/27/2016 15:14   Ct Head Wo Contrast  Result Date: 09/27/2016 CLINICAL DATA:  Syncope EXAM: CT HEAD WITHOUT CONTRAST TECHNIQUE: Contiguous axial images were obtained from the base of the skull through the vertex without intravenous contrast. COMPARISON:  None. FINDINGS: Brain: No evidence of acute infarction, hemorrhage, hydrocephalus, extra-axial collection or mass lesion/mass effect. Vascular: Negative for hyperdense vessel Skull: Negative Sinuses/Orbits: Negative Other: None IMPRESSION: Negative Electronically Signed   By: Franchot Gallo M.D.   On: 09/27/2016 14:55     1700:  +UTI, UC pending; will dose IV rocephin.   Pt and family informed re: dx testing results, including UTI, and that I recommend  observation admission for further evaluation for her recurrent syncope.  Pt refuses admission.  I encouraged pt to stay, continues to refuse.  Pt makes her own medical decisions.  Risks of AMA explained to pt and family, including, but not limited to:  stroke, heart attack, cardiac arrythmia ("irregular heart rate/beat"), "passing out," temporary and/or permanent disability, death.  Pt and family verb understanding and continue to refuse admission, understanding the consequences of their decision.  I encouraged pt to follow up with her PMD and Cardiologist tomorrow and return to the ED immediately if symptoms return, or for any other concerns.  Pt and family verb understanding, agreeable.   Final Clinical Impressions(s) / ED Diagnoses   Final diagnoses:  None    New Prescriptions New Prescriptions   No medications on file     Francine Graven, DO 10/02/16 1836

## 2016-09-27 NOTE — ED Triage Notes (Signed)
PT reports being at the nail salon getting nails done and had a syncopal episode after beginning to feel nauseated and dizzy.  Unsure how long it lasted.  Pt denies complaints now, but states she is just tired.

## 2016-09-27 NOTE — Discharge Instructions (Signed)
Take the prescription as directed.  Call your regular medical doctor and your Cardiologist tomorrow to schedule a follow up appointment within the next 1 to 2 days.  Return to the Emergency Department immediately sooner if worsening.

## 2016-09-27 NOTE — ED Notes (Signed)
Pt took 2 glucose tablets prior to ems arrival.

## 2016-09-30 LAB — URINE CULTURE: Culture: >=100000 — AB

## 2016-10-01 ENCOUNTER — Telehealth: Payer: Self-pay

## 2016-10-01 NOTE — Telephone Encounter (Signed)
Post ED Visit - Positive Culture Follow-up  Culture report reviewed by antimicrobial stewardship pharmacist:  []  Elenor Quinones, Pharm.D. []  Heide Guile, Pharm.D., BCPS AQ-ID []  Parks Neptune, Pharm.D., BCPS [x]  Alycia Rossetti, Pharm.D., BCPS []  [place], [place].D., BCPS, AAHIVP []  Legrand Como, Pharm.D., BCPS, AAHIVP []  Salome Arnt, PharmD, BCPS []  Dimitri Ped, PharmD, BCPS []  Vincenza Hews, PharmD, BCPS  Positive urine culture Treated with Cephalexin, organism sensitive to the same and no further patient follow-up is required at this time.  Genia Del 10/01/2016, 10:44 AM

## 2017-01-10 ENCOUNTER — Ambulatory Visit (INDEPENDENT_AMBULATORY_CARE_PROVIDER_SITE_OTHER): Payer: PPO | Admitting: Cardiovascular Disease

## 2017-01-10 ENCOUNTER — Encounter: Payer: Self-pay | Admitting: Cardiovascular Disease

## 2017-01-10 VITALS — BP 118/74 | HR 74 | Ht 63.0 in | Wt 180.6 lb

## 2017-01-10 DIAGNOSIS — E782 Mixed hyperlipidemia: Secondary | ICD-10-CM

## 2017-01-10 DIAGNOSIS — E1169 Type 2 diabetes mellitus with other specified complication: Secondary | ICD-10-CM | POA: Diagnosis not present

## 2017-01-10 DIAGNOSIS — I471 Supraventricular tachycardia: Secondary | ICD-10-CM | POA: Diagnosis not present

## 2017-01-10 DIAGNOSIS — I10 Essential (primary) hypertension: Secondary | ICD-10-CM | POA: Diagnosis not present

## 2017-01-10 DIAGNOSIS — R55 Syncope and collapse: Secondary | ICD-10-CM

## 2017-01-10 DIAGNOSIS — E669 Obesity, unspecified: Secondary | ICD-10-CM | POA: Diagnosis not present

## 2017-01-10 NOTE — Patient Instructions (Signed)
Dr Croitoru recommends that you schedule a follow-up appointment in 12 months. You will receive a reminder letter in the mail two months in advance. If you don't receive a letter, please call our office to schedule the follow-up appointment.  If you need a refill on your cardiac medications before your next appointment, please call your pharmacy. 

## 2017-01-10 NOTE — Progress Notes (Signed)
Cardiology Office Note    Date:  01/10/2017   ID:  [name], [name] 1946/04/04, MRN [id number]  PCP:  Celene Squibb, MD  Cardiologist:   Sanda Klein, MD   Chief complaint: Syncope, Coronary risk factors   History of Present Illness:  Brenda Hatfield is a 71 y.o. female returns for her first follow-up visit since May 2014. She has a history of systemic hypertension, type 2 diabetes mellitus, dyslipidemia, obesity, supraventricular tachycardia and a cardiac murmur.  After starting Jardiance and Xultophy, she has had substantial weight loss (20 pounds in last 3 months) and marked improvement in glycemic control (hemoglobin A1c 6.8%). This is the first time after trying many different treatments that there has been such a positive change.  She denies exertional angina or dyspnea, orthopnea, PND, leg edema, claudication or focal neurological events. She had a syncopal event during a pedicure, preceded by typical prodrome of flushing and tunnel vision. ECG , head CT and lab workup showed normal findings. She states that about every other year she'll have a near- syncopal event from which she recovers promptly if she lies flat. She is usually not aware of the trigger. This has happened for at least the last 20 years and it sounds that she is describing neurally mediated syncope.  She has cut down her lisinopril to 10 mg every other day as she has lost weight and has noticed her blood pressure trending down.  A nuclear stress test in 2012 showed breast attenuation artifact, but was otherwise normal. An echocardiogram in 2001 was a mostly normal study: Systolic function, XB>14%, very mild left ventricular hypertrophy. There was mild mitral annular calcification and trivial MR, aortic valve sclerosis without stenosis which likely explains cardiac murmur. The left atrium was mildly enlarged at 4.5 cm.  Has not lost any weight in the last 3 years but has not gained any either. She hasn't had  palpitations in several years. She denies angina and exertional dyspnea. She was recently started on a new type of insulin and on Jardiance. Her last hemoglobin A1c was a little high at 7.3% Dr. Nevada Crane has checked her cholesterol and she believes the results were satisfactory.   Past Medical History:  Diagnosis Date  . Chest pain 09/16/2010   normal myocardial perfusion study EF=61%  . Diabetes mellitus ([place])    type 2  . GERD (gastroesophageal reflux disease)   . History of cardiac murmur 02/23/00   2D Echo EF greater than 55%  . Hyperlipemia   . Obesity   . Palpitation   . SVT (supraventricular tachycardia) ([place])   . Systemic hypertension     Past Surgical History:  Procedure Laterality Date  . CATARACT EXTRACTION Right 2003   [place]  . CATARACT EXTRACTION W/PHACO Left 04/09/2013   Procedure: LEFT EYE CATARACT EXTRACTION PHACO AND INTRAOCULAR LENS PLACEMENT ;  Surgeon: Tonny Branch, MD;  Location: [facility];  Service: Ophthalmology;  Laterality: Left;  CDE 10.78  . TONSILLECTOMY  1957  . TUBAL LIGATION  1980    Current Medications: Outpatient Medications Prior to Visit  Medication Sig Dispense Refill  . aspirin 81 MG tablet Take 81 mg by mouth daily.    [name] atorvastatin (LIPITOR) 40 MG tablet Take 40 mg by mouth daily.    . empagliflozin (JARDIANCE) 10 MG TABS tablet Take 10 mg by mouth daily.    [name] glucose 4 GM chewable tablet Chew 0.5 tablets by mouth as needed for low blood sugar.    [name]  insulin aspart (NOVOLOG FLEXPEN) 100 UNIT/ML SOPN FlexPen Inject 6-8 Units into the skin 3 (three) times daily with meals.    . Insulin Degludec-Liraglutide (XULTOPHY) 100-3.6 UNIT-MG/ML SOPN Inject 28-32 Units into the skin daily.    [name] lisinopril (PRINIVIL,ZESTRIL) 10 MG tablet Take 10 mg by mouth daily.    . metFORMIN (GLUCOPHAGE) 1000 MG tablet Take 1,000 mg by mouth 2 (two) times daily with a meal.    . metoprolol succinate (TOPROL-XL) 25 MG 24 hr tablet Take 25 mg by mouth daily.    [name]  omeprazole (PRILOSEC) 20 MG capsule Take 20 mg by mouth every other day.     . cephALEXin (KEFLEX) 500 MG capsule Take 1 capsule (500 mg total) by mouth 4 (four) times daily. (Patient not taking: Reported on 01/10/2017) 40 capsule 0   No facility-administered medications prior to visit.      Allergies:   Patient has no known allergies.   Social History   Social History  . Marital status: Married    Spouse name: N/A  . Number of children: N/A  . Years of education: N/A   Social History Main Topics  . Smoking status: Never Smoker  . Smokeless tobacco: Not on file  . Alcohol use No  . Drug use: No  . Sexual activity: Yes    Birth control/ protection: None   Other Topics Concern  . Not on file   Social History Narrative  . No narrative on file     Family History:  The patient's family history includes Diabetes in her father; Parkinson's disease in her father.   ROS:   Please see the history of present illness.    ROS All other systems reviewed and are negative.   PHYSICAL EXAM:   VS:  BP 118/74   Pulse 74   Ht 5\' 3"  (1.6 m)   Wt 180 lb 9.6 oz (81.9 kg)   BMI 31.99 kg/m     General: Alert, oriented x3, no distress, mildly obese Head: no evidence of trauma, PERRL, EOMI, no exophtalmos or lid lag, no myxedema, no xanthelasma; normal ears, nose and oropharynx Neck: normal jugular venous pulsations and no hepatojugular reflux; brisk carotid pulses without delay and no carotid bruits Chest: clear to auscultation, no signs of consolidation by percussion or palpation, normal fremitus, symmetrical and full respiratory excursions Cardiovascular: normal position and quality of the apical impulse, regular rhythm, normal first and second heart sounds, no murmurs, rubs or gallops Abdomen: no tenderness or distention, no masses by palpation, no abnormal pulsatility or arterial bruits, normal bowel sounds, no hepatosplenomegaly Extremities: no clubbing, cyanosis or edema; 2+ radial,  ulnar and brachial pulses bilaterally; 2+ right femoral, posterior tibial and dorsalis pedis pulses; 2+ left femoral, posterior tibial and dorsalis pedis pulses; no subclavian or femoral bruits Neurological: grossly nonfocal Psych: Normal mood and affect   Wt Readings from Last 3 Encounters:  01/10/17 180 lb 9.6 oz (81.9 kg)  09/27/16 200 lb (90.7 kg)  12/10/15 198 lb 3.2 oz (89.9 kg)      Studies/Labs Reviewed:   EKG:  EKG is ordered today.  The ekg ordered 09/27/2016 shows normal sinus rhythm, normal tracing   Recent Labs: 09/27/2016: ALT 28; BUN 18; Creatinine, Ser 0.79; Hemoglobin 12.6; Platelets 244; Potassium 4.2; Sodium 138   Lipid Panel    Component Value Date/Time   CHOL 189 03/11/2008 2045   TRIG 180 (H) 03/11/2008 2045   HDL 69 03/11/2008 2045   CHOLHDL 2.7 Ratio  03/11/2008 2045   VLDL 36 03/11/2008 2045   LDLCALC 84 03/11/2008 2045      ASSESSMENT:    1. Vasovagal syncope   2. Diabetes mellitus type 2 in obese ([place])   3. Essential hypertension   4. Supraventricular tachycardia ([place])   5. Mixed hyperlipidemia   6. Mild obesity      PLAN:  In order of problems listed above:  1. Syncope: Most recent event as well as her long-standing history are highly suggestive of neurally mediated syncope. Stay well hydrated. Needs to markedly increase fluid intake because of the new diabetes medicine she takes. Can indulge in an occasional salty, sugar-free snack.  2. DM: Control of glycemia is markedly improved and she is on an excellent medical regimen for diabetes, at least as far as the cardiovascular risk profile is concerned. 3. HTN: As she loses weight her blood pressure continues to drop. Reduce lisinopril to 5 mg daily. 4. SVT: No recurrence after we stopped her diltiazem 5. HLP: On statin, will request her most recent labs from PCP. Suspect that her triglycerides have also improved now that her glycemic control is better. 6. Obesity: Treatment with  empagliflozin and liraglutide should be the optimal combination to help her lose weight and improve cardiovascular risk.    Medication Adjustments/Labs and Tests Ordered: Current medicines are reviewed at length with the patient today.  Concerns regarding medicines are outlined above.  Medication changes, Labs and Tests ordered today are listed in the Patient Instructions below. Patient Instructions  Dr Sallyanne Kuster recommends that you schedule a follow-up appointment in 12 months. You will receive a reminder letter in the mail two months in advance. If you don't receive a letter, please call our office to schedule the follow-up appointment.  If you need a refill on your cardiac medications before your next appointment, please call your pharmacy.    Signed, Sanda Klein, MD  01/10/2017 6:11 PM    [facility] [address], [place], [place]  [address] Phone: [phone]; Fax: [phone]

## 2017-02-23 DIAGNOSIS — E782 Mixed hyperlipidemia: Secondary | ICD-10-CM | POA: Diagnosis not present

## 2017-02-23 DIAGNOSIS — I10 Essential (primary) hypertension: Secondary | ICD-10-CM | POA: Diagnosis not present

## 2017-02-23 DIAGNOSIS — Z23 Encounter for immunization: Secondary | ICD-10-CM | POA: Diagnosis not present

## 2017-02-23 DIAGNOSIS — E119 Type 2 diabetes mellitus without complications: Secondary | ICD-10-CM | POA: Diagnosis not present

## 2017-03-07 DIAGNOSIS — E782 Mixed hyperlipidemia: Secondary | ICD-10-CM | POA: Diagnosis not present

## 2017-03-07 DIAGNOSIS — I10 Essential (primary) hypertension: Secondary | ICD-10-CM | POA: Diagnosis not present

## 2017-03-07 DIAGNOSIS — Z6830 Body mass index (BMI) 30.0-30.9, adult: Secondary | ICD-10-CM | POA: Diagnosis not present

## 2017-03-07 DIAGNOSIS — E119 Type 2 diabetes mellitus without complications: Secondary | ICD-10-CM | POA: Diagnosis not present

## 2017-04-19 HISTORY — PX: MOUTH SURGERY: SHX715

## 2017-04-26 DIAGNOSIS — D1809 Hemangioma of other sites: Secondary | ICD-10-CM | POA: Diagnosis not present

## 2017-04-26 DIAGNOSIS — M795 Residual foreign body in soft tissue: Secondary | ICD-10-CM | POA: Diagnosis not present

## 2017-05-04 DIAGNOSIS — D1809 Hemangioma of other sites: Secondary | ICD-10-CM | POA: Diagnosis not present

## 2017-05-04 DIAGNOSIS — M795 Residual foreign body in soft tissue: Secondary | ICD-10-CM | POA: Diagnosis not present

## 2017-06-03 ENCOUNTER — Encounter: Payer: Self-pay | Admitting: Obstetrics & Gynecology

## 2017-06-09 ENCOUNTER — Encounter: Payer: Self-pay | Admitting: Obstetrics & Gynecology

## 2017-06-09 ENCOUNTER — Ambulatory Visit (INDEPENDENT_AMBULATORY_CARE_PROVIDER_SITE_OTHER): Payer: PPO | Admitting: Obstetrics & Gynecology

## 2017-06-09 VITALS — BP 122/78 | Ht 63.3 in | Wt 178.2 lb

## 2017-06-09 DIAGNOSIS — Z01411 Encounter for gynecological examination (general) (routine) with abnormal findings: Secondary | ICD-10-CM

## 2017-06-09 DIAGNOSIS — Z13820 Encounter for screening for osteoporosis: Secondary | ICD-10-CM

## 2017-06-09 DIAGNOSIS — Z780 Asymptomatic menopausal state: Secondary | ICD-10-CM

## 2017-06-09 NOTE — Progress Notes (Signed)
Brenda Hatfield 1945/09/24 [id number]   History:    72 y.o. G3P3L3 Married.  Has grand-children.  RP:  Established patient presenting for annual gyn exam   HPI: Menopause, well on no HRT.  No PMB.  No pelvic pain.  No pain with intercourse.  Urine and bowel movements normal.  Breasts normal.  Body mass index 31.27.  Health labs with family physician.  Past medical history,surgical history, family history and social history were all reviewed and documented in the EPIC chart.  Gynecologic History No LMP recorded. Patient is postmenopausal. Contraception: post menopausal status Last Pap: 05/2016. Results were: negative Last mammogram: 06/10/2017. Results were: Scheduled tomorrow Bone Density: Will schedule here now Colonoscopy: Declines Colono  Obstetric History OB History  Gravida Para Term Preterm AB Living  3         3  SAB TAB Ectopic Multiple Live Births               # Outcome Date GA Lbr Len/2nd Weight Sex Delivery Anes PTL Lv  3 Gravida           2 Gravida           1 Gravida                ROS: A ROS was performed and pertinent positives and negatives are included in the history.  GENERAL: No fevers or chills. HEENT: No change in vision, no earache, sore throat or sinus congestion. NECK: No pain or stiffness. CARDIOVASCULAR: No chest pain or pressure. No palpitations. PULMONARY: No shortness of breath, cough or wheeze. GASTROINTESTINAL: No abdominal pain, nausea, vomiting or diarrhea, melena or bright red blood per rectum. GENITOURINARY: No urinary frequency, urgency, hesitancy or dysuria. MUSCULOSKELETAL: No joint or muscle pain, no back pain, no recent trauma. DERMATOLOGIC: No rash, no itching, no lesions. ENDOCRINE: No polyuria, polydipsia, no heat or cold intolerance. No recent change in weight. HEMATOLOGICAL: No anemia or easy bruising or bleeding. NEUROLOGIC: No headache, seizures, numbness, tingling or weakness. PSYCHIATRIC: No depression, no loss of interest in  normal activity or change in sleep pattern.     Exam:   BP 122/78 (BP Location: Right Leg, Patient Position: Sitting, Cuff Size: Large)   Ht 5' 3.3" (1.608 m)   Wt 178 lb 3.2 oz (80.8 kg)   BMI 31.27 kg/m   Body mass index is 31.27 kg/m.  General appearance : Well developed well nourished female. No acute distress HEENT: Eyes: no retinal hemorrhage or exudates,  Neck supple, trachea midline, no carotid bruits, no thyroidmegaly Lungs: Clear to auscultation, no rhonchi or wheezes, or rib retractions  Heart: Regular rate and rhythm, no murmurs or gallops Breast:Examined in sitting and supine position were symmetrical in appearance, no palpable masses or tenderness,  no skin retraction, no nipple inversion, no nipple discharge, no skin discoloration, no axillary or supraclavicular lymphadenopathy Abdomen: no palpable masses or tenderness, no rebound or guarding Extremities: no edema or skin discoloration or tenderness  Pelvic: Vulva: Normal             Vagina: No gross lesions or discharge  Cervix: No gross lesions or discharge  Uterus  AV, normal size, shape and consistency, non-tender and mobile  Adnexa  Without masses or tenderness  Anus: Normal   Assessment/Plan:  72 y.o. female for annual exam   1. Encounter for gynecological examination with abnormal finding Normal gyn exam in menopause.  Pap negative 05/2016.  Breasts wnl.  Screening Mammo scheduled  06/10/2017.  Health labs with Fam MD. Declines colonoscopy.  2. Menopause present Well on no HRT.  No postmenopausal bleeding.  3. Screening for osteoporosis Will do Vit D level with Fam MD in 3 weeks.  F/U here for Bone density. - DG Bone Density; Future  Princess Bruins MD, 9:30 AM 06/09/2017

## 2017-06-10 ENCOUNTER — Encounter: Payer: Self-pay | Admitting: Obstetrics & Gynecology

## 2017-06-10 DIAGNOSIS — Z1231 Encounter for screening mammogram for malignant neoplasm of breast: Secondary | ICD-10-CM | POA: Diagnosis not present

## 2017-06-10 NOTE — Patient Instructions (Signed)
1. Encounter for gynecological examination with abnormal finding Normal gyn exam in menopause.  Pap negative 05/2016.  Breasts wnl.  Screening Mammo scheduled 06/10/2017.  Health labs with Fam MD. Declines colonoscopy.  2. Menopause present Well on no HRT.  No postmenopausal bleeding.  3. Screening for osteoporosis Will do Vit D level with Fam MD in 3 weeks.  F/U here for Bone density. - DG Bone Density; Future  Brenda Hatfield, it was a pleasure seeing you today!     Health Maintenance for Postmenopausal Women Menopause is a normal process in which your reproductive ability comes to an end. This process happens gradually over a span of months to years, usually between the ages of 26 and 41. Menopause is complete when you have missed 12 consecutive menstrual periods. It is important to talk with your health care provider about some of the most common conditions that affect postmenopausal women, such as heart disease, cancer, and bone loss (osteoporosis). Adopting a healthy lifestyle and getting preventive care can help to promote your health and wellness. Those actions can also lower your chances of developing some of these common conditions. What should I know about menopause? During menopause, you may experience a number of symptoms, such as:  Moderate-to-severe hot flashes.  Night sweats.  Decrease in sex drive.  Mood swings.  Headaches.  Tiredness.  Irritability.  Memory problems.  Insomnia.  Choosing to treat or not to treat menopausal changes is an individual decision that you make with your health care provider. What should I know about hormone replacement therapy and supplements? Hormone therapy products are effective for treating symptoms that are associated with menopause, such as hot flashes and night sweats. Hormone replacement carries certain risks, especially as you become older. If you are thinking about using estrogen or estrogen with progestin treatments, discuss the  benefits and risks with your health care provider. What should I know about heart disease and stroke? Heart disease, heart attack, and stroke become more likely as you age. This may be due, in part, to the hormonal changes that your body experiences during menopause. These can affect how your body processes dietary fats, triglycerides, and cholesterol. Heart attack and stroke are both medical emergencies. There are many things that you can do to help prevent heart disease and stroke:  Have your blood pressure checked at least every 1-2 years. High blood pressure causes heart disease and increases the risk of stroke.  If you are 83-21 years old, ask your health care provider if you should take aspirin to prevent a heart attack or a stroke.  Do not use any tobacco products, including cigarettes, chewing tobacco, or electronic cigarettes. If you need help quitting, ask your health care provider.  It is important to eat a healthy diet and maintain a healthy weight. ? Be sure to include plenty of vegetables, fruits, low-fat dairy products, and lean protein. ? Avoid eating foods that are high in solid fats, added sugars, or salt (sodium).  Get regular exercise. This is one of the most important things that you can do for your health. ? Try to exercise for at least 150 minutes each week. The type of exercise that you do should increase your heart rate and make you sweat. This is known as moderate-intensity exercise. ? Try to do strengthening exercises at least twice each week. Do these in addition to the moderate-intensity exercise.  Know your numbers.Ask your health care provider to check your cholesterol and your blood glucose. Continue to have  your blood tested as directed by your health care provider.  What should I know about cancer screening? There are several types of cancer. Take the following steps to reduce your risk and to catch any cancer development as early as possible. Breast  Cancer  Practice breast self-awareness. ? This means understanding how your breasts normally appear and feel. ? It also means doing regular breast self-exams. Let your health care provider know about any changes, no matter how small.  If you are 55 or older, have a clinician do a breast exam (clinical breast exam or CBE) every year. Depending on your age, family history, and medical history, it may be recommended that you also have a yearly breast X-ray (mammogram).  If you have a family history of breast cancer, talk with your health care provider about genetic screening.  If you are at high risk for breast cancer, talk with your health care provider about having an MRI and a mammogram every year.  Breast cancer (BRCA) gene test is recommended for women who have family members with BRCA-related cancers. Results of the assessment will determine the need for genetic counseling and BRCA1 and for BRCA2 testing. BRCA-related cancers include these types: ? Breast. This occurs in males or females. ? Ovarian. ? Tubal. This may also be called fallopian tube cancer. ? Cancer of the abdominal or pelvic lining (peritoneal cancer). ? Prostate. ? Pancreatic.  Cervical, Uterine, and Ovarian Cancer Your health care provider may recommend that you be screened regularly for cancer of the pelvic organs. These include your ovaries, uterus, and vagina. This screening involves a pelvic exam, which includes checking for microscopic changes to the surface of your cervix (Pap test).  For women ages 21-65, health care providers may recommend a pelvic exam and a Pap test every three years. For women ages 24-65, they may recommend the Pap test and pelvic exam, combined with testing for human papilloma virus (HPV), every five years. Some types of HPV increase your risk of cervical cancer. Testing for HPV may also be done on women of any age who have unclear Pap test results.  Other health care providers may not  recommend any screening for nonpregnant women who are considered low risk for pelvic cancer and have no symptoms. Ask your health care provider if a screening pelvic exam is right for you.  If you have had past treatment for cervical cancer or a condition that could lead to cancer, you need Pap tests and screening for cancer for at least 20 years after your treatment. If Pap tests have been discontinued for you, your risk factors (such as having a new sexual partner) need to be reassessed to determine if you should start having screenings again. Some women have medical problems that increase the chance of getting cervical cancer. In these cases, your health care provider may recommend that you have screening and Pap tests more often.  If you have a family history of uterine cancer or ovarian cancer, talk with your health care provider about genetic screening.  If you have vaginal bleeding after reaching menopause, tell your health care provider.  There are currently no reliable tests available to screen for ovarian cancer.  Lung Cancer Lung cancer screening is recommended for adults 68-56 years old who are at high risk for lung cancer because of a history of smoking. A yearly low-dose CT scan of the lungs is recommended if you:  Currently smoke.  Have a history of at least 30 pack-years of  smoking and you currently smoke or have quit within the past 15 years. A pack-year is smoking an average of one pack of cigarettes per day for one year.  Yearly screening should:  Continue until it has been 15 years since you quit.  Stop if you develop a health problem that would prevent you from having lung cancer treatment.  Colorectal Cancer  This type of cancer can be detected and can often be prevented.  Routine colorectal cancer screening usually begins at age 46 and continues through age 75.  If you have risk factors for colon cancer, your health care provider may recommend that you be screened  at an earlier age.  If you have a family history of colorectal cancer, talk with your health care provider about genetic screening.  Your health care provider may also recommend using home test kits to check for hidden blood in your stool.  A small camera at the end of a tube can be used to examine your colon directly (sigmoidoscopy or colonoscopy). This is done to check for the earliest forms of colorectal cancer.  Direct examination of the colon should be repeated every 5-10 years until age 74. However, if early forms of precancerous polyps or small growths are found or if you have a family history or genetic risk for colorectal cancer, you may need to be screened more often.  Skin Cancer  Check your skin from head to toe regularly.  Monitor any moles. Be sure to tell your health care provider: ? About any new moles or changes in moles, especially if there is a change in a mole's shape or color. ? If you have a mole that is larger than the size of a pencil eraser.  If any of your family members has a history of skin cancer, especially at a young age, talk with your health care provider about genetic screening.  Always use sunscreen. Apply sunscreen liberally and repeatedly throughout the day.  Whenever you are outside, protect yourself by wearing long sleeves, pants, a wide-brimmed hat, and sunglasses.  What should I know about osteoporosis? Osteoporosis is a condition in which bone destruction happens more quickly than new bone creation. After menopause, you may be at an increased risk for osteoporosis. To help prevent osteoporosis or the bone fractures that can happen because of osteoporosis, the following is recommended:  If you are 66-45 years old, get at least 1,000 mg of calcium and at least 600 mg of vitamin D per day.  If you are older than age 69 but younger than age 20, get at least 1,200 mg of calcium and at least 600 mg of vitamin D per day.  If you are older than age 70,  get at least 1,200 mg of calcium and at least 800 mg of vitamin D per day.  Smoking and excessive alcohol intake increase the risk of osteoporosis. Eat foods that are rich in calcium and vitamin D, and do weight-bearing exercises several times each week as directed by your health care provider. What should I know about how menopause affects my mental health? Depression may occur at any age, but it is more common as you become older. Common symptoms of depression include:  Low or sad mood.  Changes in sleep patterns.  Changes in appetite or eating patterns.  Feeling an overall lack of motivation or enjoyment of activities that you previously enjoyed.  Frequent crying spells.  Talk with your health care provider if you think that you are  experiencing depression. What should I know about immunizations? It is important that you get and maintain your immunizations. These include:  Tetanus, diphtheria, and pertussis (Tdap) booster vaccine.  Influenza every year before the flu season begins.  Pneumonia vaccine.  Shingles vaccine.  Your health care provider may also recommend other immunizations. This information is not intended to replace advice given to you by your health care provider. Make sure you discuss any questions you have with your health care provider. Document Released: 05/28/2005 Document Revised: 10/24/2015 Document Reviewed: 01/07/2015 Elsevier Interactive Patient Education  2018 [employer].

## 2017-06-17 DIAGNOSIS — M8580 Other specified disorders of bone density and structure, unspecified site: Secondary | ICD-10-CM

## 2017-06-17 HISTORY — DX: Other specified disorders of bone density and structure, unspecified site: M85.80

## 2017-06-21 DIAGNOSIS — E559 Vitamin D deficiency, unspecified: Secondary | ICD-10-CM | POA: Diagnosis not present

## 2017-06-21 DIAGNOSIS — E782 Mixed hyperlipidemia: Secondary | ICD-10-CM | POA: Diagnosis not present

## 2017-06-21 DIAGNOSIS — E119 Type 2 diabetes mellitus without complications: Secondary | ICD-10-CM | POA: Diagnosis not present

## 2017-06-21 DIAGNOSIS — I10 Essential (primary) hypertension: Secondary | ICD-10-CM | POA: Diagnosis not present

## 2017-06-22 ENCOUNTER — Other Ambulatory Visit: Payer: Self-pay | Admitting: Gynecology

## 2017-06-22 DIAGNOSIS — Z780 Asymptomatic menopausal state: Secondary | ICD-10-CM

## 2017-06-23 DIAGNOSIS — Z713 Dietary counseling and surveillance: Secondary | ICD-10-CM | POA: Diagnosis not present

## 2017-06-23 DIAGNOSIS — I10 Essential (primary) hypertension: Secondary | ICD-10-CM | POA: Diagnosis not present

## 2017-06-23 DIAGNOSIS — E119 Type 2 diabetes mellitus without complications: Secondary | ICD-10-CM | POA: Diagnosis not present

## 2017-06-23 DIAGNOSIS — Z6831 Body mass index (BMI) 31.0-31.9, adult: Secondary | ICD-10-CM | POA: Diagnosis not present

## 2017-07-05 ENCOUNTER — Encounter: Payer: Self-pay | Admitting: Gynecology

## 2017-07-05 ENCOUNTER — Ambulatory Visit (INDEPENDENT_AMBULATORY_CARE_PROVIDER_SITE_OTHER): Payer: PPO

## 2017-07-05 ENCOUNTER — Telehealth: Payer: Self-pay

## 2017-07-05 ENCOUNTER — Other Ambulatory Visit: Payer: Self-pay | Admitting: Gynecology

## 2017-07-05 DIAGNOSIS — M85851 Other specified disorders of bone density and structure, right thigh: Secondary | ICD-10-CM

## 2017-07-05 DIAGNOSIS — M8588 Other specified disorders of bone density and structure, other site: Secondary | ICD-10-CM | POA: Diagnosis not present

## 2017-07-05 DIAGNOSIS — Z780 Asymptomatic menopausal state: Secondary | ICD-10-CM

## 2017-07-05 NOTE — Telephone Encounter (Signed)
   FYI: Brenda Hatfield is here today for her Dexa scan today. She wanted you to know, Dr. Nevada Crane checked her Vit D level. Per patient he  started her on daily Vit D because the level was low.

## 2017-09-14 ENCOUNTER — Emergency Department (HOSPITAL_COMMUNITY): Payer: PPO

## 2017-09-14 ENCOUNTER — Other Ambulatory Visit: Payer: Self-pay

## 2017-09-14 ENCOUNTER — Encounter (HOSPITAL_COMMUNITY): Payer: Self-pay | Admitting: Emergency Medicine

## 2017-09-14 ENCOUNTER — Emergency Department (HOSPITAL_COMMUNITY)
Admission: EM | Admit: 2017-09-14 | Discharge: 2017-09-14 | Disposition: A | Discharge status: Home / Self Care | Payer: PPO | Attending: Emergency Medicine | Admitting: Emergency Medicine

## 2017-09-14 DIAGNOSIS — S52501A Unspecified fracture of the lower end of right radius, initial encounter for closed fracture: Secondary | ICD-10-CM

## 2017-09-14 DIAGNOSIS — Z794 Long term (current) use of insulin: Secondary | ICD-10-CM | POA: Diagnosis not present

## 2017-09-14 DIAGNOSIS — S42431A Displaced fracture (avulsion) of lateral epicondyle of right humerus, initial encounter for closed fracture: Secondary | ICD-10-CM | POA: Diagnosis not present

## 2017-09-14 DIAGNOSIS — Y929 Unspecified place or not applicable: Secondary | ICD-10-CM | POA: Diagnosis not present

## 2017-09-14 DIAGNOSIS — S4991XA Unspecified injury of right shoulder and upper arm, initial encounter: Secondary | ICD-10-CM | POA: Diagnosis not present

## 2017-09-14 DIAGNOSIS — S99911A Unspecified injury of right ankle, initial encounter: Secondary | ICD-10-CM | POA: Diagnosis not present

## 2017-09-14 DIAGNOSIS — Z7982 Long term (current) use of aspirin: Secondary | ICD-10-CM | POA: Diagnosis not present

## 2017-09-14 DIAGNOSIS — W109XXA Fall (on) (from) unspecified stairs and steps, initial encounter: Secondary | ICD-10-CM | POA: Diagnosis not present

## 2017-09-14 DIAGNOSIS — Z79899 Other long term (current) drug therapy: Secondary | ICD-10-CM | POA: Insufficient documentation

## 2017-09-14 DIAGNOSIS — Y999 Unspecified external cause status: Secondary | ICD-10-CM | POA: Insufficient documentation

## 2017-09-14 DIAGNOSIS — S40011A Contusion of right shoulder, initial encounter: Secondary | ICD-10-CM

## 2017-09-14 DIAGNOSIS — E119 Type 2 diabetes mellitus without complications: Secondary | ICD-10-CM | POA: Diagnosis not present

## 2017-09-14 DIAGNOSIS — S82831A Other fracture of upper and lower end of right fibula, initial encounter for closed fracture: Secondary | ICD-10-CM | POA: Diagnosis not present

## 2017-09-14 DIAGNOSIS — S99811A Other specified injuries of right ankle, initial encounter: Secondary | ICD-10-CM | POA: Diagnosis present

## 2017-09-14 DIAGNOSIS — Y939 Activity, unspecified: Secondary | ICD-10-CM | POA: Diagnosis not present

## 2017-09-14 DIAGNOSIS — M7989 Other specified soft tissue disorders: Secondary | ICD-10-CM | POA: Diagnosis not present

## 2017-09-14 DIAGNOSIS — M25511 Pain in right shoulder: Secondary | ICD-10-CM | POA: Diagnosis not present

## 2017-09-14 HISTORY — DX: Other seasonal allergic rhinitis: J30.2

## 2017-09-14 LAB — CBG MONITORING, ED
GLUCOSE-CAPILLARY: 123 mg/dL — AB (ref 65–99)
Glucose-Capillary: 128 mg/dL — ABNORMAL HIGH (ref 65–99)

## 2017-09-14 MED ORDER — OXYCODONE-ACETAMINOPHEN 5-325 MG PO TABS: 1.0000 | ORAL_TABLET | ORAL | 0 refills | Status: DC | PRN

## 2017-09-14 MED ORDER — OXYCODONE-ACETAMINOPHEN 5-325 MG PO TABS
1.0000 | ORAL_TABLET | Freq: Once | ORAL | Status: AC
  Administered 2017-09-14: 1 via ORAL
  Filled 2017-09-14: qty 1

## 2017-09-14 MED ORDER — SODIUM CHLORIDE 0.9 % IV BOLUS
1000.0000 mL | Freq: Once | INTRAVENOUS | Status: AC
  Administered 2017-09-14: 1000 mL via INTRAVENOUS

## 2017-09-14 MED ORDER — IBUPROFEN 400 MG PO TABS
400.0000 mg | ORAL_TABLET | Freq: Once | ORAL | Status: AC
  Administered 2017-09-14: 400 mg via ORAL
  Filled 2017-09-14: qty 1

## 2017-09-14 NOTE — ED Provider Notes (Signed)
[facility] EMERGENCY DEPARTMENT Provider Note   CSN: [id number] Arrival date & time: 09/14/17  1434     History   Chief Complaint Chief Complaint  Patient presents with  . Fall    HPI Brenda Hatfield is a 72 y.o. female.  HPI  72 year old female presenting after mechanical fall.  She missed the last step and fell on her right side.  She did not strike her head.  Denies any headaches, neck or back pain.  She is complaining of pain in her right shoulder, right wrist and right ankle.  He has not tried ambulating since the fall.  She is not anticoagulated.  Past Medical History:  Diagnosis Date  . Chest pain 09/16/2010   normal myocardial perfusion study EF=61%  . Diabetes mellitus ([place])    type 2  . GERD (gastroesophageal reflux disease)   . History of cardiac murmur 02/23/00   2D Echo EF greater than 55%  . Hyperlipemia   . Obesity   . Osteopenia 06/2017   T score -1.2 FRAX 9% / 1.1%  . Palpitation   . Seasonal allergies   . SVT (supraventricular tachycardia) ([place])   . Systemic hypertension     Patient Active Problem List   Diagnosis Date Noted  . Vasovagal syncope 01/10/2017  . Supraventricular tachycardia ([place]) 01/10/2017  . WEAKNESS 11/22/2008  . Diabetes mellitus type 2 in obese ([place]) 01/05/2008  . Mild obesity 10/06/2007  . VITAMIN D DEFICIENCY 07/06/2007  . ONYCHOLYSIS 07/06/2007  . SYMPTOMS INVOLVING CARDIOVASCULAR SYSTEM NEC 12/26/2006  . Essential hypertension 09/26/2006  . SINUSITIS, ACUTE 05/27/2006  . CYST, THYROID 04/23/2006  . Mixed hyperlipidemia 04/23/2006  . ANEMIA-NOS 04/23/2006  . DEPRESSION 04/23/2006  . CATARACT NOS 04/23/2006  . GERD 04/23/2006  . CONSTIPATION NOS 04/23/2006  . OSTEOARTHRITIS 04/23/2006  . ARRHYTHMIA, HX OF 04/23/2006    Past Surgical History:  Procedure Laterality Date  . CATARACT EXTRACTION Right 2003   [place]  . CATARACT EXTRACTION W/PHACO Left 04/09/2013   Procedure: LEFT EYE CATARACT EXTRACTION PHACO  AND INTRAOCULAR LENS PLACEMENT ;  Surgeon: Tonny Branch, MD;  Location: [facility];  Service: Ophthalmology;  Laterality: Left;  CDE 10.78  . MOUTH SURGERY  04/2017   spot removed by Dr. Buelah Manis   . TONSILLECTOMY  [phone]  . TUBAL LIGATION  1980     OB History    Gravida  3   Para      Term      Preterm      AB      Living  3     SAB      TAB      Ectopic      Multiple      Live Births               Home Medications    Prior to Admission medications   Medication Sig Start Date End Date Taking? Authorizing Provider  aspirin 81 MG tablet Take 81 mg by mouth daily.    [provider]  atorvastatin (LIPITOR) 40 MG tablet Take 40 mg by mouth daily.    [provider]  empagliflozin (JARDIANCE) 10 MG TABS tablet Take 10 mg by mouth daily.    [provider]  glucose 4 GM chewable tablet Chew 0.5 tablets by mouth as needed for low blood sugar.    [provider]  insulin aspart (NOVOLOG FLEXPEN) 100 UNIT/ML SOPN FlexPen Inject 6-8 Units into the skin 3 (three) times  daily with meals.    [provider]  Insulin Degludec-Liraglutide (XULTOPHY) 100-3.6 UNIT-MG/ML SOPN Inject 28-32 Units into the skin daily.    [provider]  KRILL OIL PO Take by mouth.    [provider]  lisinopril (PRINIVIL,ZESTRIL) 10 MG tablet Take 10 mg by mouth daily.    [provider]  metFORMIN (GLUCOPHAGE) 1000 MG tablet Take 1,000 mg by mouth 2 (two) times daily with a meal.    [provider]  metoprolol succinate (TOPROL-XL) 25 MG 24 hr tablet Take 25 mg by mouth daily.    [provider]  Multiple Vitamin (MULTIVITAMIN) capsule Take 1 capsule by mouth daily.    [provider]  omeprazole (PRILOSEC) 20 MG capsule Take 20 mg by mouth every other day.     [provider]  oxyCODONE-acetaminophen (PERCOCET/ROXICET) 5-325 MG tablet Take 1-2 tablets by mouth every 4 (four) hours as needed. 09/14/17    Virgel Manifold, MD    Family History Family History  Problem Relation Age of Onset  . Parkinson's disease Father   . Diabetes Father     Social History Social History   Tobacco Use  . Smoking status: Never Smoker  . Smokeless tobacco: Never Used  Substance Use Topics  . Alcohol use: No  . Drug use: No     Allergies   Patient has no known allergies.   Review of Systems Review of Systems  All systems reviewed and negative, other than as noted in HPI.  Physical Exam Updated Vital Signs BP (!) 106/58   Pulse 85   Temp 98.1 F (36.7 C) (Oral)   Resp 15   SpO2 98%   Physical Exam  Constitutional: She appears well-developed and well-nourished. No distress.  HENT:  Head: Normocephalic and atraumatic.  Eyes: Conjunctivae are normal. Right eye exhibits no discharge. Left eye exhibits no discharge.  Neck: Neck supple.  Cardiovascular: Normal rate, regular rhythm and normal heart sounds. Exam reveals no gallop and no friction rub.  No murmur heard. Pulmonary/Chest: Effort normal and breath sounds normal. No respiratory distress.  Abdominal: Soft. She exhibits no distension. There is no tenderness.  Musculoskeletal: She exhibits tenderness.  No midline spinal tenderness.  Tenderness over the right AC joint.  She can actively range her shoulder although with increased pain, particularly abduction.  Mild swelling and tenderness of the right wrist.  Closed injury.  Neurovascularly intact.  Swelling and tenderness to palpation over the lateral malleolus of the right ankle.  She can actively range ankle with increased pain as well.  Palpable DP pulse.  Sensation intact light touch.  Neurological: She is alert.  Skin: Skin is warm and dry.  Psychiatric: She has a normal mood and affect. Her behavior is normal. Thought content normal.  Nursing note and vitals reviewed.    ED Treatments / Results  Labs (all labs ordered are listed, but only abnormal results are  displayed) Labs Reviewed  CBG MONITORING, ED - Abnormal; Notable for the following components:      Result Value   Glucose-Capillary 123 (*)    All other components within normal limits  CBG MONITORING, ED - Abnormal; Notable for the following components:   Glucose-Capillary 128 (*)    All other components within normal limits    EKG EKG Interpretation  Date/Time:  Wednesday Sep 14 2017 18:16:43 EDT Ventricular Rate:  86 PR Interval:    QRS Duration: 88 QT Interval:  387 QTC Calculation: 463 R Axis:  39 Text Interpretation:  Sinus rhythm Borderline T wave abnormalities Confirmed by Virgel Manifold [phone]) on 09/14/2017 6:30:31 PM   Radiology Dg Shoulder Right  Result Date: 09/14/2017 CLINICAL DATA:  72 year old female status post fall. EXAM: RIGHT SHOULDER - 2+ VIEW COMPARISON:  Chest radiographs 09/27/2016. FINDINGS: Philip Aspen and scapular Y-views only of the right shoulder. The patient would not tolerate an axillary view. No glenohumeral joint dislocation. The proximal right humerus appears intact. No right clavicle or scapula fracture is evident. Visible right ribs and lung parenchyma appear stable. IMPRESSION: No acute fracture or dislocation identified about the right shoulder. Electronically Signed   By: Genevie Ann M.D.   On: 09/14/2017 16:46   Dg Wrist Complete Right  Result Date: 09/14/2017 CLINICAL DATA:  Fall EXAM: RIGHT WRIST - COMPLETE 3+ VIEW COMPARISON:  None. FINDINGS: There are tiny bony densities adjacent to the tip of the radius. These may represent avulsion fractures of indeterminate age. Mild osteopenia. Bony foramina is otherwise intact. Soft tissue calcifications in the location of the triangular fibrocartilage suggest chondrocalcinosis. IMPRESSION: Tiny avulsion fractures from the tip of the radius of indeterminate age are not excluded. Chronic changes. Electronically Signed   By: Marybelle Killings M.D.   On: 09/14/2017 16:41   Dg Ankle Complete Right  Result Date:  09/14/2017 CLINICAL DATA:  72 year old female status post fall. EXAM: RIGHT ANKLE - COMPLETE 3+ VIEW COMPARISON:  None. FINDINGS: Moderate to severe soft tissue swelling at the ankle maximal at the lateral malleolus. No joint effusion is evident. Mortise joint alignment preserved. Talar dome intact. Chronic appearing ossific fragments at the tip of the lateral malleolus. Questionable superimposed nondisplaced oblique fracture through the lateral malleolus (image 2). The distal tibia and calcaneus appear intact. Other visible. Bones of the right foot appear intact IMPRESSION: 1. Soft tissue swelling with questionable nondisplaced fracture of the right lateral malleolus. Superimposed chronic appearing fracture fragments at the tip of that malleolus. 2. No other acute fracture or dislocation identified about the right ankle. Electronically Signed   By: Genevie Ann M.D.   On: 09/14/2017 16:44    Procedures Procedures (including critical care time)  Medications Ordered in ED Medications  oxyCODONE-acetaminophen (PERCOCET/ROXICET) 5-325 MG per tablet 1 tablet (1 tablet Oral Given 09/14/17 1733)  ibuprofen (ADVIL,MOTRIN) tablet 400 mg (400 mg Oral Given 09/14/17 1733)  sodium chloride 0.9 % bolus 1,000 mL (1,000 mLs Intravenous New Bag/Given 09/14/17 1756)     Initial Impression / Assessment and Plan / ED Course  I have reviewed the triage vital signs and the nursing notes.  Pertinent labs & imaging results that were available during my care of the patient were reviewed by me and considered in my medical decision making (see chart for details).     72 year old female with right shoulder, wrist and ankle pain after mechanical fall.  Imaging as above.  Questionable how she is here irregularities in her wrist films and ankle films may be real as she is tender over these areas.  We will splint.  As needed pain medication.  Orthopedic follow-up.  Final Clinical Impressions(s) / ED Diagnoses   Final diagnoses:   Closed fracture of distal end of right fibula, unspecified fracture morphology, initial encounter  Closed fracture of distal end of right radius, unspecified fracture morphology, initial encounter  Contusion of right shoulder, initial encounter    ED Discharge Orders        Ordered    oxyCODONE-acetaminophen (PERCOCET/ROXICET) 5-325 MG tablet  Every 4 hours PRN  09/14/17 1801       Virgel Manifold, MD 09/14/17 1836

## 2017-09-14 NOTE — ED Triage Notes (Signed)
Pt fell this am and c/o pain to right wrist, right shoulder, right ankle pain. swlling noted to rgiht ankle and wrist. Soreness noted with palpation to right shoulder. States right knee and elbow is sore but "they are fine". A/o. Pt was dizzy and nauseated due to pain per pt about one hour after fall. Denies hitting head. C/o some nausea now. Has eaten since fall.

## 2017-09-19 ENCOUNTER — Ambulatory Visit: Payer: PPO | Admitting: Orthopedic Surgery

## 2017-09-19 VITALS — BP 108/62 | HR 80 | Ht 63.0 in | Wt 178.0 lb

## 2017-09-19 DIAGNOSIS — S63501A Unspecified sprain of right wrist, initial encounter: Secondary | ICD-10-CM | POA: Diagnosis not present

## 2017-09-19 DIAGNOSIS — S82839A Other fracture of upper and lower end of unspecified fibula, initial encounter for closed fracture: Secondary | ICD-10-CM

## 2017-09-19 NOTE — Progress Notes (Signed)
NEW PATIENT OFFICE VISIT   Chief Complaint  Patient presents with  . Ankle Injury    ER follow up on right ankle fracture, DOI 09-14-17.     MEDICAL DECISION SECTION  xrays ordered? no  My independent reading of xrays:  X-ray #1 set right shoulder.  Dated Sep 14, 2017 from the hospital chronic rotator cuff changes suggestive of chronic rotator cuff tendinitis or tendinopathy   Second set right ankle x-rays 3 views dated Sep 14, 2017 from the hospital ankle mortise looks intact, Small avulsion fracture right distal fibula  Third set of x-rays also from the [facility]th 2019 [facility] right wrist x-rays 4 views no acute fracture seen there are some tiny little fragments near the radial styloid may indicate wrist sprain avulsion fracture   Plan (Follow-up visits regular office visits no fracture care) Wrist brace Velcro splint  Aircast  Weight-bear as tolerated    INJECTION  no   FURTHER WORK UP PLANNED no     Chief Complaint  Patient presents with  . Ankle Injury    ER follow up on right ankle fracture, DOI 09-14-17.     72 year old female fell going out of her house on May 29.  Presents for evaluation and treatment of avulsion fracture right fibula and possible avulsion fracture right distal radius  She complains of mild pain right wrist right ankle mild swelling right ankle complains of right shoulder pain is 5 days associated with swelling of the ankle mild swelling of the wrist and more severe pain right shoulder  Patient fell around December had some right shoulder pain severe at that time   Review of Systems  Respiratory: Negative for shortness of breath.   Cardiovascular: Negative for chest pain.  Musculoskeletal: Positive for joint pain.  Neurological: Negative for tingling.     Past Medical History:  Diagnosis Date  . Chest pain 09/16/2010   normal myocardial perfusion study EF=61%  . Diabetes mellitus ([place])    type 2  . GERD (gastroesophageal  reflux disease)   . History of cardiac murmur 02/23/00   2D Echo EF greater than 55%  . Hyperlipemia   . Obesity   . Osteopenia 06/2017   T score -1.2 FRAX 9% / 1.1%  . Palpitation   . Seasonal allergies   . SVT (supraventricular tachycardia) ([place])   . Systemic hypertension     Past Surgical History:  Procedure Laterality Date  . CATARACT EXTRACTION Right 2003   [place]  . CATARACT EXTRACTION W/PHACO Left 04/09/2013   Procedure: LEFT EYE CATARACT EXTRACTION PHACO AND INTRAOCULAR LENS PLACEMENT ;  Surgeon: Tonny Branch, MD;  Location: [facility];  Service: Ophthalmology;  Laterality: Left;  CDE 10.78  . MOUTH SURGERY  04/2017   spot removed by Dr. Buelah Manis   . TONSILLECTOMY  [phone]  . TUBAL LIGATION  1980    Family History  Problem Relation Age of Onset  . Parkinson's disease Father   . Diabetes Father    Social History   Tobacco Use  . Smoking status: Never Smoker  . Smokeless tobacco: Never Used  Substance Use Topics  . Alcohol use: No  . Drug use: No    No Known Allergies   No outpatient medications have been marked as taking for the 09/19/17 encounter (Office Visit) with Carole Civil, MD.    There were no vitals taken for this visit.  Physical Exam  Constitutional: She is oriented to person, place, and time. She appears well-developed  and well-nourished.  Musculoskeletal:       Feet:  Neurological: She is alert and oriented to person, place, and time.  Psychiatric: She has a normal mood and affect. Judgment normal.  Vitals reviewed.   Ortho Exam   Right wrist tenderness around the radial styloid with normal active and passive range of motion no instability on stress testing neurovascular exam is intact skin is normal   Arther Abbott, MD  09/19/2017 9:52 AM

## 2017-09-19 NOTE — Patient Instructions (Signed)
Weight-bear as tolerated  Braces can be removed for bathing and sleeping

## 2017-10-14 DIAGNOSIS — S82831A Other fracture of upper and lower end of right fibula, initial encounter for closed fracture: Secondary | ICD-10-CM | POA: Insufficient documentation

## 2017-10-17 ENCOUNTER — Encounter: Payer: Self-pay | Admitting: Orthopedic Surgery

## 2017-10-17 ENCOUNTER — Ambulatory Visit: Payer: PPO | Admitting: Orthopedic Surgery

## 2017-10-17 VITALS — BP 111/72 | HR 77 | Ht 63.0 in | Wt 178.0 lb

## 2017-10-17 DIAGNOSIS — S82831D Other fracture of upper and lower end of right fibula, subsequent encounter for closed fracture with routine healing: Secondary | ICD-10-CM | POA: Diagnosis not present

## 2017-10-17 DIAGNOSIS — S63501D Unspecified sprain of right wrist, subsequent encounter: Secondary | ICD-10-CM

## 2017-10-17 NOTE — Progress Notes (Signed)
Progress Note   Patient ID: SANTANA GOSDIN, female   DOB: [birth year], 72 y.o.   MRN: [id number]   Chief Complaint  Patient presents with  . Wrist Injury    09/14/17 improving right wrist   . Ankle Injury    09/14/17 improving right ankle    HPI 72 year old female follow-up for right wrist injury right ankle injury both treated with braces patient says she has returned to normal  ROS No stiffness numbness tingling in either site reexamination left  No Known Allergies   BP 111/72   Pulse 77   Ht 5\' 3"  (1.6 m)   Wt 178 lb (80.7 kg)   BMI 31.53 kg/m   Physical Exam   Reexamination right wrist normal range of motion no instability good strength skin is normal there is no tenderness neurovascular exam is intact  Reexamination right ankle normal range of motion stability strength skin pulse perfusion neurovascularly intact   Medical decisions:   Data  Imaging:   Prior images showed avulsion fractures of the wrist and ankle  Encounter Diagnoses  Name Primary?  . Closed avulsion fracture of distal end of right fibula with routine healing, subsequent encounter 09/14/17   . Sprain of right wrist, subsequent encounter Yes    PLAN:   Patient can resume all normal activities she can remove the braces follow-up with [place] as needed    Arther Abbott, MD 10/17/2017 10:43 AM

## 2017-10-19 DIAGNOSIS — E119 Type 2 diabetes mellitus without complications: Secondary | ICD-10-CM | POA: Diagnosis not present

## 2017-10-19 DIAGNOSIS — Z713 Dietary counseling and surveillance: Secondary | ICD-10-CM | POA: Diagnosis not present

## 2017-10-19 DIAGNOSIS — Z23 Encounter for immunization: Secondary | ICD-10-CM | POA: Diagnosis not present

## 2017-10-19 DIAGNOSIS — E6609 Other obesity due to excess calories: Secondary | ICD-10-CM | POA: Diagnosis not present

## 2017-10-19 DIAGNOSIS — Z6831 Body mass index (BMI) 31.0-31.9, adult: Secondary | ICD-10-CM | POA: Diagnosis not present

## 2017-10-19 DIAGNOSIS — F411 Generalized anxiety disorder: Secondary | ICD-10-CM | POA: Diagnosis not present

## 2017-10-19 DIAGNOSIS — E782 Mixed hyperlipidemia: Secondary | ICD-10-CM | POA: Diagnosis not present

## 2017-10-19 DIAGNOSIS — I10 Essential (primary) hypertension: Secondary | ICD-10-CM | POA: Diagnosis not present

## 2017-10-19 DIAGNOSIS — Z6830 Body mass index (BMI) 30.0-30.9, adult: Secondary | ICD-10-CM | POA: Diagnosis not present

## 2017-10-26 DIAGNOSIS — E6609 Other obesity due to excess calories: Secondary | ICD-10-CM | POA: Diagnosis not present

## 2017-10-26 DIAGNOSIS — F411 Generalized anxiety disorder: Secondary | ICD-10-CM | POA: Diagnosis not present

## 2017-10-26 DIAGNOSIS — E782 Mixed hyperlipidemia: Secondary | ICD-10-CM | POA: Diagnosis not present

## 2017-10-26 DIAGNOSIS — Z8781 Personal history of (healed) traumatic fracture: Secondary | ICD-10-CM | POA: Diagnosis not present

## 2017-10-26 DIAGNOSIS — E1165 Type 2 diabetes mellitus with hyperglycemia: Secondary | ICD-10-CM | POA: Diagnosis not present

## 2017-10-26 DIAGNOSIS — Z6830 Body mass index (BMI) 30.0-30.9, adult: Secondary | ICD-10-CM | POA: Diagnosis not present

## 2017-10-26 DIAGNOSIS — I10 Essential (primary) hypertension: Secondary | ICD-10-CM | POA: Diagnosis not present

## 2017-11-16 DIAGNOSIS — I10 Essential (primary) hypertension: Secondary | ICD-10-CM | POA: Diagnosis not present

## 2017-11-16 DIAGNOSIS — E782 Mixed hyperlipidemia: Secondary | ICD-10-CM | POA: Diagnosis not present

## 2017-11-16 DIAGNOSIS — F411 Generalized anxiety disorder: Secondary | ICD-10-CM | POA: Diagnosis not present

## 2017-11-16 DIAGNOSIS — E119 Type 2 diabetes mellitus without complications: Secondary | ICD-10-CM | POA: Diagnosis not present

## 2017-11-16 DIAGNOSIS — E1165 Type 2 diabetes mellitus with hyperglycemia: Secondary | ICD-10-CM | POA: Diagnosis not present

## 2018-02-03 DIAGNOSIS — E119 Type 2 diabetes mellitus without complications: Secondary | ICD-10-CM | POA: Diagnosis not present

## 2018-02-03 DIAGNOSIS — I10 Essential (primary) hypertension: Secondary | ICD-10-CM | POA: Diagnosis not present

## 2018-02-03 DIAGNOSIS — E782 Mixed hyperlipidemia: Secondary | ICD-10-CM | POA: Diagnosis not present

## 2018-02-03 DIAGNOSIS — E1165 Type 2 diabetes mellitus with hyperglycemia: Secondary | ICD-10-CM | POA: Diagnosis not present

## 2018-02-06 DIAGNOSIS — I10 Essential (primary) hypertension: Secondary | ICD-10-CM | POA: Diagnosis not present

## 2018-02-06 DIAGNOSIS — E1165 Type 2 diabetes mellitus with hyperglycemia: Secondary | ICD-10-CM | POA: Diagnosis not present

## 2018-02-06 DIAGNOSIS — E782 Mixed hyperlipidemia: Secondary | ICD-10-CM | POA: Diagnosis not present

## 2018-02-08 DIAGNOSIS — Z713 Dietary counseling and surveillance: Secondary | ICD-10-CM | POA: Diagnosis not present

## 2018-02-08 DIAGNOSIS — I10 Essential (primary) hypertension: Secondary | ICD-10-CM | POA: Diagnosis not present

## 2018-02-08 DIAGNOSIS — Z6830 Body mass index (BMI) 30.0-30.9, adult: Secondary | ICD-10-CM | POA: Diagnosis not present

## 2018-02-08 DIAGNOSIS — E1165 Type 2 diabetes mellitus with hyperglycemia: Secondary | ICD-10-CM | POA: Diagnosis not present

## 2018-02-08 DIAGNOSIS — M8580 Other specified disorders of bone density and structure, unspecified site: Secondary | ICD-10-CM | POA: Diagnosis not present

## 2018-02-08 DIAGNOSIS — Z0000 Encounter for general adult medical examination without abnormal findings: Secondary | ICD-10-CM | POA: Diagnosis not present

## 2018-02-22 DIAGNOSIS — L708 Other acne: Secondary | ICD-10-CM | POA: Diagnosis not present

## 2018-02-22 DIAGNOSIS — D225 Melanocytic nevi of trunk: Secondary | ICD-10-CM | POA: Diagnosis not present

## 2018-02-22 DIAGNOSIS — Z1283 Encounter for screening for malignant neoplasm of skin: Secondary | ICD-10-CM | POA: Diagnosis not present

## 2018-03-08 DIAGNOSIS — E782 Mixed hyperlipidemia: Secondary | ICD-10-CM | POA: Diagnosis not present

## 2018-03-08 DIAGNOSIS — I10 Essential (primary) hypertension: Secondary | ICD-10-CM | POA: Diagnosis not present

## 2018-03-08 DIAGNOSIS — F411 Generalized anxiety disorder: Secondary | ICD-10-CM | POA: Diagnosis not present

## 2018-03-08 DIAGNOSIS — E1165 Type 2 diabetes mellitus with hyperglycemia: Secondary | ICD-10-CM | POA: Diagnosis not present

## 2018-03-23 DIAGNOSIS — I10 Essential (primary) hypertension: Secondary | ICD-10-CM | POA: Diagnosis not present

## 2018-03-23 DIAGNOSIS — F411 Generalized anxiety disorder: Secondary | ICD-10-CM | POA: Diagnosis not present

## 2018-03-23 DIAGNOSIS — E1165 Type 2 diabetes mellitus with hyperglycemia: Secondary | ICD-10-CM | POA: Diagnosis not present

## 2018-03-23 DIAGNOSIS — E782 Mixed hyperlipidemia: Secondary | ICD-10-CM | POA: Diagnosis not present

## 2018-04-21 ENCOUNTER — Ambulatory Visit (INDEPENDENT_AMBULATORY_CARE_PROVIDER_SITE_OTHER): Payer: PPO | Admitting: Cardiovascular Disease

## 2018-04-21 ENCOUNTER — Encounter: Payer: Self-pay | Admitting: Cardiovascular Disease

## 2018-04-21 ENCOUNTER — Encounter (INDEPENDENT_AMBULATORY_CARE_PROVIDER_SITE_OTHER): Payer: Self-pay

## 2018-04-21 VITALS — BP 102/74 | HR 81 | Ht 63.0 in | Wt 181.0 lb

## 2018-04-21 DIAGNOSIS — I471 Supraventricular tachycardia: Secondary | ICD-10-CM

## 2018-04-21 DIAGNOSIS — E782 Mixed hyperlipidemia: Secondary | ICD-10-CM | POA: Diagnosis not present

## 2018-04-21 DIAGNOSIS — E119 Type 2 diabetes mellitus without complications: Secondary | ICD-10-CM | POA: Diagnosis not present

## 2018-04-21 DIAGNOSIS — E669 Obesity, unspecified: Secondary | ICD-10-CM

## 2018-04-21 DIAGNOSIS — Z794 Long term (current) use of insulin: Secondary | ICD-10-CM

## 2018-04-21 DIAGNOSIS — R55 Syncope and collapse: Secondary | ICD-10-CM

## 2018-04-21 NOTE — Progress Notes (Signed)
Cardiology Office Note    Date:  04/22/2018   ID:  Brenda Hatfield, DOB [birth year], MRN [id number]  PCP:  Celene Squibb, MD  Cardiologist:   Sanda Klein, MD   Chief complaint: Syncope, Coronary risk factors   History of Present Illness:  Brenda Hatfield is a 73 y.o. female returns for follow-up for a history of type 2 diabetes mellitus, dyslipidemia, obesity, supraventricular tachycardia and neurally mediated syncope.  Glycemic control has deteriorated a little bit but remains acceptable with an A1c of 7.1%.  Her weight loss has stabilized.  She has not had any new syncopal events.  Her blood pressure remains relatively low.  She is on a lower dose of ACE inhibitor for renal protection from diabetes, not hypertension.  The patient specifically denies any chest pain at rest exertion, dyspnea at rest or with exertion, orthopnea, paroxysmal nocturnal dyspnea, syncope, palpitations, focal neurological deficits, intermittent claudication, lower extremity edema, unexplained weight gain, cough, hemoptysis or wheezing.  She has a lifelong history of occasional episodes of syncope or near syncope which she can abort by sitting down or lying down.  A nuclear stress test in 2012 showed breast attenuation artifact, but was otherwise normal. An echocardiogram in 2001 was a mostly normal study: Systolic function, MW>10%, very mild left ventricular hypertrophy. There was mild mitral annular calcification and trivial MR, aortic valve sclerosis without stenosis which likely explains cardiac murmur. The left atrium was mildly enlarged at 4.5 cm.   Past Medical History:  Diagnosis Date  . Chest pain 09/16/2010   normal myocardial perfusion study EF=61%  . Diabetes mellitus ([place])    type 2  . GERD (gastroesophageal reflux disease)   . History of cardiac murmur 02/23/00   2D Echo EF greater than 55%  . Hyperlipemia   . Obesity   . Osteopenia 06/2017   T score -1.2 FRAX 9% / 1.1%  . Palpitation   .  Seasonal allergies   . SVT (supraventricular tachycardia) ([place])   . Systemic hypertension     Past Surgical History:  Procedure Laterality Date  . CATARACT EXTRACTION Right 2003   [place]  . CATARACT EXTRACTION W/PHACO Left 04/09/2013   Procedure: LEFT EYE CATARACT EXTRACTION PHACO AND INTRAOCULAR LENS PLACEMENT ;  Surgeon: Tonny Branch, MD;  Location: [facility];  Service: Ophthalmology;  Laterality: Left;  CDE 10.78  . MOUTH SURGERY  04/2017   spot removed by Dr. Buelah Manis   . TONSILLECTOMY  [phone]  . TUBAL LIGATION  1980    Current Medications: Outpatient Medications Prior to Visit  Medication Sig Dispense Refill  . aspirin 81 MG tablet Take 81 mg by mouth daily.    [name] atorvastatin (LIPITOR) 80 MG tablet Take 80 mg by mouth daily.    . empagliflozin (JARDIANCE) 10 MG TABS tablet Take 10 mg by mouth daily.    [name] glucose 4 GM chewable tablet Chew 0.5 tablets by mouth as needed for low blood sugar.    . Insulin Degludec-Liraglutide (XULTOPHY) 100-3.6 UNIT-MG/ML SOPN Inject 28-32 Units into the skin daily.    [name] lisinopril (PRINIVIL,ZESTRIL) 5 MG tablet Take 5 mg by mouth daily.    . metFORMIN (GLUCOPHAGE) 1000 MG tablet Take 1,000 mg by mouth 2 (two) times daily with a meal.    . metoprolol succinate (TOPROL-XL) 25 MG 24 hr tablet Take 25 mg by mouth daily.    . Multiple Vitamin (MULTIVITAMIN) capsule Take 1 capsule by mouth daily.    [name] omeprazole (PRILOSEC)  20 MG capsule Take 20 mg by mouth every other day.     [name] atorvastatin (LIPITOR) 40 MG tablet Take 40 mg by mouth daily.    . insulin aspart (NOVOLOG FLEXPEN) 100 UNIT/ML SOPN FlexPen Inject 6-8 Units into the skin 3 (three) times daily with meals.    [name] KRILL OIL PO Take by mouth.    [name] lisinopril (PRINIVIL,ZESTRIL) 10 MG tablet Take 10 mg by mouth daily.     No facility-administered medications prior to visit.      Allergies:   Patient has no known allergies.   Social History   Socioeconomic History  . Marital status: Married     Spouse name: Not on file  . Number of children: Not on file  . Years of education: Not on file  . Highest education level: Not on file  Occupational History  . Not on file  Social Needs  . Financial resource strain: Not on file  . Food insecurity:    Worry: Not on file    Inability: Not on file  . Transportation needs:    Medical: Not on file    Non-medical: Not on file  Tobacco Use  . Smoking status: Never Smoker  . Smokeless tobacco: Never Used  Substance and Sexual Activity  . Alcohol use: No  . Drug use: No  . Sexual activity: Yes    Partners: Male    Birth control/protection: None    Comment: First sexual intercouse at 73 yrs old. Less than partnere in a life time.   Lifestyle  . Physical activity:    Days per week: Not on file    Minutes per session: Not on file  . Stress: Not on file  Relationships  . Social connections:    Talks on phone: Not on file    Gets together: Not on file    Attends religious service: Not on file    Active member of club or organization: Not on file    Attends meetings of clubs or organizations: Not on file    Relationship status: Not on file  Other Topics Concern  . Not on file  Social History Narrative  . Not on file     Family History:  The patient's family history includes Diabetes in her father; Parkinson's disease in her father.   ROS:   Please see the history of present illness.    ROS All other systems reviewed and are negative.   PHYSICAL EXAM:   VS:  BP 102/74 (BP Location: Right Arm, Patient Position: Sitting, Cuff Size: Normal)   Pulse 81   Ht 5\' 3"  (1.6 m)   Wt 181 lb (82.1 kg)   BMI 32.06 kg/m      General: Alert, oriented x3, no distress, mildly obese Head: no evidence of trauma, PERRL, EOMI, no exophtalmos or lid lag, no myxedema, no xanthelasma; normal ears, nose and oropharynx Neck: normal jugular venous pulsations and no hepatojugular reflux; brisk carotid pulses without delay and no carotid  bruits Chest: clear to auscultation, no signs of consolidation by percussion or palpation, normal fremitus, symmetrical and full respiratory excursions Cardiovascular: normal position and quality of the apical impulse, regular rhythm, normal first and second heart sounds, 2/6 early peaking systolic ejection murmur in the aortic focus no diastolic murmurs, rubs or gallops Abdomen: no tenderness or distention, no masses by palpation, no abnormal pulsatility or arterial bruits, normal bowel sounds, no hepatosplenomegaly Extremities: no clubbing, cyanosis or edema; 2+ radial, ulnar and brachial  pulses bilaterally; 2+ right femoral, posterior tibial and dorsalis pedis pulses; 2+ left femoral, posterior tibial and dorsalis pedis pulses; no subclavian or femoral bruits Neurological: grossly nonfocal Psych: Normal mood and affect   Wt Readings from Last 3 Encounters:  04/21/18 181 lb (82.1 kg)  10/17/17 178 lb (80.7 kg)  09/19/17 178 lb (80.7 kg)      Studies/Labs Reviewed:   EKG:  EKG is ordered today.  Shows normal sinus rhythm, normal tracing.  QTc 436 ms  Recent Labs: No results found for requested labs within last 8760 hours.   Lipid Panel    Component Value Date/Time   CHOL 189 03/11/2008 2045   TRIG 180 (H) 03/11/2008 2045   HDL 69 03/11/2008 2045   CHOLHDL 2.7 Ratio 03/11/2008 2045   VLDL 36 03/11/2008 2045   LDLCALC 84 03/11/2008 2045      ASSESSMENT:    1. Vasovagal syncope   2. Controlled type 2 diabetes mellitus without complication, with long-term current use of insulin ([place])   3. SVT (supraventricular tachycardia) ([place])   4. Mixed hyperlipidemia   5. Mild obesity      PLAN:  In order of problems listed above:  1. Syncope: No recent events. 2. DM: Fair if not perfect control of hypoglycemia.  I am pleased that she is taking Jardiance.  On a lower dose of ACE inhibitor to prevent hypotension after she lost some weight. 3. SVT: No symptomatic recur with rence  after we stopped her diltiazem. 4. HLP: On statin, labs monitored by PCP.  Most recent LDL was 70. 5. Obesity: After initial weight loss with Jardiance and liraglutide, her weight has stabilized in the mildly obese range.   Medication Adjustments/Labs and Tests Ordered: Current medicines are reviewed at length with the patient today.  Concerns regarding medicines are outlined above.  Medication changes, Labs and Tests ordered today are listed in the Patient Instructions below. Patient Instructions  Medication Instructions:  Dr Sallyanne Kuster recommends that you continue on your current medications as directed. Please refer to the Current Medication list given to you today.  If you need a refill on your cardiac medications before your next appointment, please call your pharmacy.   Follow-Up: At [facility], you and your health needs are our priority.  As part of our continuing mission to provide you with exceptional heart care, we have created designated Provider Care Teams.  These Care Teams include your primary Cardiologist (physician) and Advanced Practice Providers (APPs -  Physician Assistants and Nurse Practitioners) who all work together to provide you with the care you need, when you need it. You will need a follow up appointment in 12 months.  Please call our office 2 months in advance to schedule this appointment.  You may see Sanda Klein, MD or one of the following Advanced Practice Providers on your designated Care Team: [place], [place] . Fabian Sharp, PA-C . You will receive a reminder letter in the mail two months in advance. If you don't receive a letter, please call our office to schedule the follow-up appointment.    Signed, Sanda Klein, MD  04/22/2018 10:46 AM    [facility] [address], [place], [place]  [address] Phone: [phone]; Fax: [phone]

## 2018-04-21 NOTE — Patient Instructions (Signed)
Medication Instructions:  Dr Croitoru recommends that you continue on your current medications as directed. Please refer to the Current Medication list given to you today.  If you need a refill on your cardiac medications before your next appointment, please call your pharmacy.   Follow-Up: At [facility], you and your health needs are our priority.  As part of our continuing mission to provide you with exceptional heart care, we have created designated Provider Care Teams.  These Care Teams include your primary Cardiologist (physician) and Advanced Practice Providers (APPs -  Physician Assistants and Nurse Practitioners) who all work together to provide you with the care you need, when you need it. You will need a follow up appointment in 12 months.  Please call our office 2 months in advance to schedule this appointment.  You may see Mihai Croitoru, MD or one of the following Advanced Practice Providers on your designated Care Team: Hao Meng, PA-C . Angela Duke, PA-C . You will receive a reminder letter in the mail two months in advance. If you don't receive a letter, please call our office to schedule the follow-up appointment. 

## 2018-04-22 DIAGNOSIS — E119 Type 2 diabetes mellitus without complications: Secondary | ICD-10-CM | POA: Insufficient documentation

## 2018-04-22 DIAGNOSIS — Z794 Long term (current) use of insulin: Secondary | ICD-10-CM

## 2018-05-16 DIAGNOSIS — E119 Type 2 diabetes mellitus without complications: Secondary | ICD-10-CM | POA: Diagnosis not present

## 2018-05-16 DIAGNOSIS — Z6831 Body mass index (BMI) 31.0-31.9, adult: Secondary | ICD-10-CM | POA: Diagnosis not present

## 2018-05-16 DIAGNOSIS — E782 Mixed hyperlipidemia: Secondary | ICD-10-CM | POA: Diagnosis not present

## 2018-05-16 DIAGNOSIS — E1165 Type 2 diabetes mellitus with hyperglycemia: Secondary | ICD-10-CM | POA: Diagnosis not present

## 2018-05-16 DIAGNOSIS — I10 Essential (primary) hypertension: Secondary | ICD-10-CM | POA: Diagnosis not present

## 2018-05-26 DIAGNOSIS — I10 Essential (primary) hypertension: Secondary | ICD-10-CM | POA: Diagnosis not present

## 2018-05-26 DIAGNOSIS — E119 Type 2 diabetes mellitus without complications: Secondary | ICD-10-CM | POA: Diagnosis not present

## 2018-05-26 DIAGNOSIS — E1165 Type 2 diabetes mellitus with hyperglycemia: Secondary | ICD-10-CM | POA: Diagnosis not present

## 2018-05-26 DIAGNOSIS — F411 Generalized anxiety disorder: Secondary | ICD-10-CM | POA: Diagnosis not present

## 2018-05-26 DIAGNOSIS — E782 Mixed hyperlipidemia: Secondary | ICD-10-CM | POA: Diagnosis not present

## 2018-06-20 DIAGNOSIS — M8580 Other specified disorders of bone density and structure, unspecified site: Secondary | ICD-10-CM | POA: Diagnosis not present

## 2018-06-20 DIAGNOSIS — E6609 Other obesity due to excess calories: Secondary | ICD-10-CM | POA: Diagnosis not present

## 2018-06-20 DIAGNOSIS — I10 Essential (primary) hypertension: Secondary | ICD-10-CM | POA: Diagnosis not present

## 2018-06-20 DIAGNOSIS — I471 Supraventricular tachycardia: Secondary | ICD-10-CM | POA: Diagnosis not present

## 2018-06-20 DIAGNOSIS — E1169 Type 2 diabetes mellitus with other specified complication: Secondary | ICD-10-CM | POA: Diagnosis not present

## 2018-06-20 DIAGNOSIS — E782 Mixed hyperlipidemia: Secondary | ICD-10-CM | POA: Diagnosis not present

## 2018-06-20 DIAGNOSIS — F331 Major depressive disorder, recurrent, moderate: Secondary | ICD-10-CM | POA: Diagnosis not present

## 2018-08-21 DIAGNOSIS — E1165 Type 2 diabetes mellitus with hyperglycemia: Secondary | ICD-10-CM | POA: Diagnosis not present

## 2018-08-21 DIAGNOSIS — F411 Generalized anxiety disorder: Secondary | ICD-10-CM | POA: Diagnosis not present

## 2018-08-21 DIAGNOSIS — E782 Mixed hyperlipidemia: Secondary | ICD-10-CM | POA: Diagnosis not present

## 2018-08-21 DIAGNOSIS — I10 Essential (primary) hypertension: Secondary | ICD-10-CM | POA: Diagnosis not present

## 2018-08-30 DIAGNOSIS — Z0000 Encounter for general adult medical examination without abnormal findings: Secondary | ICD-10-CM | POA: Diagnosis not present

## 2018-09-27 DIAGNOSIS — E1165 Type 2 diabetes mellitus with hyperglycemia: Secondary | ICD-10-CM | POA: Diagnosis not present

## 2018-09-27 DIAGNOSIS — E782 Mixed hyperlipidemia: Secondary | ICD-10-CM | POA: Diagnosis not present

## 2018-09-27 DIAGNOSIS — F411 Generalized anxiety disorder: Secondary | ICD-10-CM | POA: Diagnosis not present

## 2018-09-27 DIAGNOSIS — E119 Type 2 diabetes mellitus without complications: Secondary | ICD-10-CM | POA: Diagnosis not present

## 2018-09-27 DIAGNOSIS — I10 Essential (primary) hypertension: Secondary | ICD-10-CM | POA: Diagnosis not present

## 2018-09-29 DIAGNOSIS — J3089 Other allergic rhinitis: Secondary | ICD-10-CM | POA: Diagnosis not present

## 2018-09-29 DIAGNOSIS — J301 Allergic rhinitis due to pollen: Secondary | ICD-10-CM | POA: Diagnosis not present

## 2018-09-29 DIAGNOSIS — T781XXD Other adverse food reactions, not elsewhere classified, subsequent encounter: Secondary | ICD-10-CM | POA: Diagnosis not present

## 2018-09-29 DIAGNOSIS — H1045 Other chronic allergic conjunctivitis: Secondary | ICD-10-CM | POA: Diagnosis not present

## 2018-10-23 DIAGNOSIS — I10 Essential (primary) hypertension: Secondary | ICD-10-CM | POA: Diagnosis not present

## 2018-10-23 DIAGNOSIS — E1165 Type 2 diabetes mellitus with hyperglycemia: Secondary | ICD-10-CM | POA: Diagnosis not present

## 2018-10-23 DIAGNOSIS — E782 Mixed hyperlipidemia: Secondary | ICD-10-CM | POA: Diagnosis not present

## 2018-10-26 DIAGNOSIS — M8580 Other specified disorders of bone density and structure, unspecified site: Secondary | ICD-10-CM | POA: Diagnosis not present

## 2018-10-26 DIAGNOSIS — E6609 Other obesity due to excess calories: Secondary | ICD-10-CM | POA: Diagnosis not present

## 2018-10-26 DIAGNOSIS — I471 Supraventricular tachycardia: Secondary | ICD-10-CM | POA: Diagnosis not present

## 2018-10-26 DIAGNOSIS — E782 Mixed hyperlipidemia: Secondary | ICD-10-CM | POA: Diagnosis not present

## 2018-10-26 DIAGNOSIS — I10 Essential (primary) hypertension: Secondary | ICD-10-CM | POA: Diagnosis not present

## 2018-10-26 DIAGNOSIS — H1045 Other chronic allergic conjunctivitis: Secondary | ICD-10-CM | POA: Diagnosis not present

## 2018-10-26 DIAGNOSIS — F331 Major depressive disorder, recurrent, moderate: Secondary | ICD-10-CM | POA: Diagnosis not present

## 2018-10-26 DIAGNOSIS — J302 Other seasonal allergic rhinitis: Secondary | ICD-10-CM | POA: Diagnosis not present

## 2018-10-26 DIAGNOSIS — E1169 Type 2 diabetes mellitus with other specified complication: Secondary | ICD-10-CM | POA: Diagnosis not present

## 2018-11-02 DIAGNOSIS — I471 Supraventricular tachycardia: Secondary | ICD-10-CM | POA: Diagnosis not present

## 2018-11-02 DIAGNOSIS — E782 Mixed hyperlipidemia: Secondary | ICD-10-CM | POA: Diagnosis not present

## 2018-11-02 DIAGNOSIS — H1045 Other chronic allergic conjunctivitis: Secondary | ICD-10-CM | POA: Diagnosis not present

## 2018-11-02 DIAGNOSIS — M8580 Other specified disorders of bone density and structure, unspecified site: Secondary | ICD-10-CM | POA: Diagnosis not present

## 2018-11-02 DIAGNOSIS — F331 Major depressive disorder, recurrent, moderate: Secondary | ICD-10-CM | POA: Diagnosis not present

## 2018-11-02 DIAGNOSIS — E1169 Type 2 diabetes mellitus with other specified complication: Secondary | ICD-10-CM | POA: Diagnosis not present

## 2018-11-02 DIAGNOSIS — I10 Essential (primary) hypertension: Secondary | ICD-10-CM | POA: Diagnosis not present

## 2018-11-02 DIAGNOSIS — E6609 Other obesity due to excess calories: Secondary | ICD-10-CM | POA: Diagnosis not present

## 2018-11-02 DIAGNOSIS — J302 Other seasonal allergic rhinitis: Secondary | ICD-10-CM | POA: Diagnosis not present

## 2018-11-16 ENCOUNTER — Other Ambulatory Visit: Payer: Self-pay

## 2019-01-01 DIAGNOSIS — H1045 Other chronic allergic conjunctivitis: Secondary | ICD-10-CM | POA: Diagnosis not present

## 2019-01-01 DIAGNOSIS — J301 Allergic rhinitis due to pollen: Secondary | ICD-10-CM | POA: Diagnosis not present

## 2019-01-01 DIAGNOSIS — J3089 Other allergic rhinitis: Secondary | ICD-10-CM | POA: Diagnosis not present

## 2019-01-09 DIAGNOSIS — J301 Allergic rhinitis due to pollen: Secondary | ICD-10-CM | POA: Diagnosis not present

## 2019-01-09 DIAGNOSIS — J3089 Other allergic rhinitis: Secondary | ICD-10-CM | POA: Diagnosis not present

## 2019-01-18 DIAGNOSIS — J301 Allergic rhinitis due to pollen: Secondary | ICD-10-CM | POA: Diagnosis not present

## 2019-01-18 DIAGNOSIS — J3089 Other allergic rhinitis: Secondary | ICD-10-CM | POA: Diagnosis not present

## 2019-01-23 DIAGNOSIS — J301 Allergic rhinitis due to pollen: Secondary | ICD-10-CM | POA: Diagnosis not present

## 2019-01-23 DIAGNOSIS — J3089 Other allergic rhinitis: Secondary | ICD-10-CM | POA: Diagnosis not present

## 2019-01-24 ENCOUNTER — Encounter: Payer: Self-pay | Admitting: Gynecology

## 2019-01-24 DIAGNOSIS — Z1231 Encounter for screening mammogram for malignant neoplasm of breast: Secondary | ICD-10-CM | POA: Diagnosis not present

## 2019-01-26 DIAGNOSIS — J3089 Other allergic rhinitis: Secondary | ICD-10-CM | POA: Diagnosis not present

## 2019-01-26 DIAGNOSIS — J301 Allergic rhinitis due to pollen: Secondary | ICD-10-CM | POA: Diagnosis not present

## 2019-01-29 DIAGNOSIS — J301 Allergic rhinitis due to pollen: Secondary | ICD-10-CM | POA: Diagnosis not present

## 2019-01-29 DIAGNOSIS — J3089 Other allergic rhinitis: Secondary | ICD-10-CM | POA: Diagnosis not present

## 2019-02-01 DIAGNOSIS — J301 Allergic rhinitis due to pollen: Secondary | ICD-10-CM | POA: Diagnosis not present

## 2019-02-01 DIAGNOSIS — J3089 Other allergic rhinitis: Secondary | ICD-10-CM | POA: Diagnosis not present

## 2019-02-06 DIAGNOSIS — J3089 Other allergic rhinitis: Secondary | ICD-10-CM | POA: Diagnosis not present

## 2019-02-06 DIAGNOSIS — J301 Allergic rhinitis due to pollen: Secondary | ICD-10-CM | POA: Diagnosis not present

## 2019-02-07 DIAGNOSIS — E782 Mixed hyperlipidemia: Secondary | ICD-10-CM | POA: Diagnosis not present

## 2019-02-07 DIAGNOSIS — E1169 Type 2 diabetes mellitus with other specified complication: Secondary | ICD-10-CM | POA: Diagnosis not present

## 2019-02-07 DIAGNOSIS — F331 Major depressive disorder, recurrent, moderate: Secondary | ICD-10-CM | POA: Diagnosis not present

## 2019-02-07 DIAGNOSIS — I10 Essential (primary) hypertension: Secondary | ICD-10-CM | POA: Diagnosis not present

## 2019-02-07 DIAGNOSIS — E6609 Other obesity due to excess calories: Secondary | ICD-10-CM | POA: Diagnosis not present

## 2019-02-07 DIAGNOSIS — J302 Other seasonal allergic rhinitis: Secondary | ICD-10-CM | POA: Diagnosis not present

## 2019-02-07 DIAGNOSIS — H1045 Other chronic allergic conjunctivitis: Secondary | ICD-10-CM | POA: Diagnosis not present

## 2019-02-07 DIAGNOSIS — I471 Supraventricular tachycardia: Secondary | ICD-10-CM | POA: Diagnosis not present

## 2019-02-07 DIAGNOSIS — M8580 Other specified disorders of bone density and structure, unspecified site: Secondary | ICD-10-CM | POA: Diagnosis not present

## 2019-02-09 DIAGNOSIS — J301 Allergic rhinitis due to pollen: Secondary | ICD-10-CM | POA: Diagnosis not present

## 2019-02-09 DIAGNOSIS — J3089 Other allergic rhinitis: Secondary | ICD-10-CM | POA: Diagnosis not present

## 2019-02-14 DIAGNOSIS — J301 Allergic rhinitis due to pollen: Secondary | ICD-10-CM | POA: Diagnosis not present

## 2019-02-14 DIAGNOSIS — J3089 Other allergic rhinitis: Secondary | ICD-10-CM | POA: Diagnosis not present

## 2019-02-16 DIAGNOSIS — J301 Allergic rhinitis due to pollen: Secondary | ICD-10-CM | POA: Diagnosis not present

## 2019-02-16 DIAGNOSIS — J3089 Other allergic rhinitis: Secondary | ICD-10-CM | POA: Diagnosis not present

## 2019-02-19 DIAGNOSIS — E782 Mixed hyperlipidemia: Secondary | ICD-10-CM | POA: Diagnosis not present

## 2019-02-19 DIAGNOSIS — E6609 Other obesity due to excess calories: Secondary | ICD-10-CM | POA: Diagnosis not present

## 2019-02-19 DIAGNOSIS — M8580 Other specified disorders of bone density and structure, unspecified site: Secondary | ICD-10-CM | POA: Diagnosis not present

## 2019-02-19 DIAGNOSIS — E1169 Type 2 diabetes mellitus with other specified complication: Secondary | ICD-10-CM | POA: Diagnosis not present

## 2019-02-19 DIAGNOSIS — J302 Other seasonal allergic rhinitis: Secondary | ICD-10-CM | POA: Diagnosis not present

## 2019-02-19 DIAGNOSIS — I10 Essential (primary) hypertension: Secondary | ICD-10-CM | POA: Diagnosis not present

## 2019-02-19 DIAGNOSIS — I471 Supraventricular tachycardia: Secondary | ICD-10-CM | POA: Diagnosis not present

## 2019-02-19 DIAGNOSIS — H1045 Other chronic allergic conjunctivitis: Secondary | ICD-10-CM | POA: Diagnosis not present

## 2019-02-19 DIAGNOSIS — F331 Major depressive disorder, recurrent, moderate: Secondary | ICD-10-CM | POA: Diagnosis not present

## 2019-02-20 DIAGNOSIS — J301 Allergic rhinitis due to pollen: Secondary | ICD-10-CM | POA: Diagnosis not present

## 2019-02-20 DIAGNOSIS — J3089 Other allergic rhinitis: Secondary | ICD-10-CM | POA: Diagnosis not present

## 2019-02-23 DIAGNOSIS — J301 Allergic rhinitis due to pollen: Secondary | ICD-10-CM | POA: Diagnosis not present

## 2019-02-23 DIAGNOSIS — J3089 Other allergic rhinitis: Secondary | ICD-10-CM | POA: Diagnosis not present

## 2019-02-27 DIAGNOSIS — J3089 Other allergic rhinitis: Secondary | ICD-10-CM | POA: Diagnosis not present

## 2019-02-27 DIAGNOSIS — J301 Allergic rhinitis due to pollen: Secondary | ICD-10-CM | POA: Diagnosis not present

## 2019-03-02 DIAGNOSIS — J3089 Other allergic rhinitis: Secondary | ICD-10-CM | POA: Diagnosis not present

## 2019-03-02 DIAGNOSIS — J301 Allergic rhinitis due to pollen: Secondary | ICD-10-CM | POA: Diagnosis not present

## 2019-03-06 DIAGNOSIS — J3089 Other allergic rhinitis: Secondary | ICD-10-CM | POA: Diagnosis not present

## 2019-03-06 DIAGNOSIS — J301 Allergic rhinitis due to pollen: Secondary | ICD-10-CM | POA: Diagnosis not present

## 2019-03-09 DIAGNOSIS — E1165 Type 2 diabetes mellitus with hyperglycemia: Secondary | ICD-10-CM | POA: Diagnosis not present

## 2019-03-09 DIAGNOSIS — E1169 Type 2 diabetes mellitus with other specified complication: Secondary | ICD-10-CM | POA: Diagnosis not present

## 2019-03-09 DIAGNOSIS — J301 Allergic rhinitis due to pollen: Secondary | ICD-10-CM | POA: Diagnosis not present

## 2019-03-09 DIAGNOSIS — E782 Mixed hyperlipidemia: Secondary | ICD-10-CM | POA: Diagnosis not present

## 2019-03-09 DIAGNOSIS — J3089 Other allergic rhinitis: Secondary | ICD-10-CM | POA: Diagnosis not present

## 2019-03-09 DIAGNOSIS — I10 Essential (primary) hypertension: Secondary | ICD-10-CM | POA: Diagnosis not present

## 2019-03-14 DIAGNOSIS — E1169 Type 2 diabetes mellitus with other specified complication: Secondary | ICD-10-CM | POA: Diagnosis not present

## 2019-03-14 DIAGNOSIS — M8580 Other specified disorders of bone density and structure, unspecified site: Secondary | ICD-10-CM | POA: Diagnosis not present

## 2019-03-14 DIAGNOSIS — H1045 Other chronic allergic conjunctivitis: Secondary | ICD-10-CM | POA: Diagnosis not present

## 2019-03-14 DIAGNOSIS — J301 Allergic rhinitis due to pollen: Secondary | ICD-10-CM | POA: Diagnosis not present

## 2019-03-14 DIAGNOSIS — F331 Major depressive disorder, recurrent, moderate: Secondary | ICD-10-CM | POA: Diagnosis not present

## 2019-03-14 DIAGNOSIS — I10 Essential (primary) hypertension: Secondary | ICD-10-CM | POA: Diagnosis not present

## 2019-03-14 DIAGNOSIS — I471 Supraventricular tachycardia: Secondary | ICD-10-CM | POA: Diagnosis not present

## 2019-03-14 DIAGNOSIS — J3089 Other allergic rhinitis: Secondary | ICD-10-CM | POA: Diagnosis not present

## 2019-03-14 DIAGNOSIS — D509 Iron deficiency anemia, unspecified: Secondary | ICD-10-CM | POA: Diagnosis not present

## 2019-03-14 DIAGNOSIS — J302 Other seasonal allergic rhinitis: Secondary | ICD-10-CM | POA: Diagnosis not present

## 2019-03-14 DIAGNOSIS — E782 Mixed hyperlipidemia: Secondary | ICD-10-CM | POA: Diagnosis not present

## 2019-03-14 DIAGNOSIS — H1030 Unspecified acute conjunctivitis, unspecified eye: Secondary | ICD-10-CM | POA: Diagnosis not present

## 2019-03-14 DIAGNOSIS — E6609 Other obesity due to excess calories: Secondary | ICD-10-CM | POA: Diagnosis not present

## 2019-03-21 DIAGNOSIS — J301 Allergic rhinitis due to pollen: Secondary | ICD-10-CM | POA: Diagnosis not present

## 2019-03-21 DIAGNOSIS — J3089 Other allergic rhinitis: Secondary | ICD-10-CM | POA: Diagnosis not present

## 2019-03-23 DIAGNOSIS — J3089 Other allergic rhinitis: Secondary | ICD-10-CM | POA: Diagnosis not present

## 2019-03-23 DIAGNOSIS — J301 Allergic rhinitis due to pollen: Secondary | ICD-10-CM | POA: Diagnosis not present

## 2019-03-25 DIAGNOSIS — Z1211 Encounter for screening for malignant neoplasm of colon: Secondary | ICD-10-CM | POA: Diagnosis not present

## 2019-03-27 DIAGNOSIS — J301 Allergic rhinitis due to pollen: Secondary | ICD-10-CM | POA: Diagnosis not present

## 2019-03-27 DIAGNOSIS — J3089 Other allergic rhinitis: Secondary | ICD-10-CM | POA: Diagnosis not present

## 2019-03-29 ENCOUNTER — Other Ambulatory Visit: Payer: Self-pay

## 2019-03-30 ENCOUNTER — Ambulatory Visit (INDEPENDENT_AMBULATORY_CARE_PROVIDER_SITE_OTHER): Payer: PPO | Admitting: Obstetrics & Gynecology

## 2019-03-30 ENCOUNTER — Encounter: Payer: Self-pay | Admitting: Obstetrics & Gynecology

## 2019-03-30 VITALS — BP 126/78 | Ht 63.0 in | Wt 173.0 lb

## 2019-03-30 DIAGNOSIS — J301 Allergic rhinitis due to pollen: Secondary | ICD-10-CM | POA: Diagnosis not present

## 2019-03-30 DIAGNOSIS — Z01419 Encounter for gynecological examination (general) (routine) without abnormal findings: Secondary | ICD-10-CM | POA: Diagnosis not present

## 2019-03-30 DIAGNOSIS — J3089 Other allergic rhinitis: Secondary | ICD-10-CM | POA: Diagnosis not present

## 2019-03-30 DIAGNOSIS — M8588 Other specified disorders of bone density and structure, other site: Secondary | ICD-10-CM

## 2019-03-30 DIAGNOSIS — Z6830 Body mass index (BMI) 30.0-30.9, adult: Secondary | ICD-10-CM

## 2019-03-30 DIAGNOSIS — E6609 Other obesity due to excess calories: Secondary | ICD-10-CM

## 2019-03-30 DIAGNOSIS — Z780 Asymptomatic menopausal state: Secondary | ICD-10-CM

## 2019-03-30 DIAGNOSIS — M85851 Other specified disorders of bone density and structure, right thigh: Secondary | ICD-10-CM

## 2019-03-30 NOTE — Progress Notes (Signed)
Brenda Hatfield [birth year] [id number]   History:    73 y.o. G3P3L3 Married.  Has grand-children.  RP:  Established patient presenting for annual gyn exam   HPI: Menopause, well on no HRT.  No PMB.  No pelvic pain.  Abstinent x 2 years.  Urine and bowel movements normal.  Breasts normal.  Body mass index 30.65.  Health labs with family physician.  Cologard done last week, pending results.  Past medical history,surgical history, family history and social history were all reviewed and documented in the EPIC chart.  Gynecologic History No LMP recorded. Patient is postmenopausal.  Obstetric History OB History  Gravida Para Term Preterm AB Living  3         3  SAB TAB Ectopic Multiple Live Births               # Outcome Date GA Lbr Len/2nd Weight Sex Delivery Anes PTL Lv  3 Gravida           2 Gravida           1 Gravida              ROS: A ROS was performed and pertinent positives and negatives are included in the history.  GENERAL: No fevers or chills. HEENT: No change in vision, no earache, sore throat or sinus congestion. NECK: No pain or stiffness. CARDIOVASCULAR: No chest pain or pressure. No palpitations. PULMONARY: No shortness of breath, cough or wheeze. GASTROINTESTINAL: No abdominal pain, nausea, vomiting or diarrhea, melena or bright red blood per rectum. GENITOURINARY: No urinary frequency, urgency, hesitancy or dysuria. MUSCULOSKELETAL: No joint or muscle pain, no back pain, no recent trauma. DERMATOLOGIC: No rash, no itching, no lesions. ENDOCRINE: No polyuria, polydipsia, no heat or cold intolerance. No recent change in weight. HEMATOLOGICAL: No anemia or easy bruising or bleeding. NEUROLOGIC: No headache, seizures, numbness, tingling or weakness. PSYCHIATRIC: No depression, no loss of interest in normal activity or change in sleep pattern.     Exam:   BP 126/78   Ht 5\' 3"  (1.6 m)   Wt 173 lb (78.5 kg)   BMI 30.65 kg/m   Body mass index is 30.65  kg/m.  General appearance : Well developed well nourished female. No acute distress HEENT: Eyes: no retinal hemorrhage or exudates,  Neck supple, trachea midline, no carotid bruits, no thyroidmegaly Lungs: Clear to auscultation, no rhonchi or wheezes, or rib retractions  Heart: Regular rate and rhythm, no murmurs or gallops Breast:Examined in sitting and supine position were symmetrical in appearance, no palpable masses or tenderness,  no skin retraction, no nipple inversion, no nipple discharge, no skin discoloration, no axillary or supraclavicular lymphadenopathy Abdomen: no palpable masses or tenderness, no rebound or guarding Extremities: no edema or skin discoloration or tenderness  Pelvic: Vulva: Normal             Vagina: No gross lesions or discharge  Cervix: No gross lesions or discharge  Uterus  AV, normal size, shape and consistency, non-tender and mobile  Adnexa  Without masses or tenderness  Anus: Normal   Assessment/Plan:  73 y.o. female for annual exam   1. Well female exam with routine gynecological exam Normal gynecologic exam in menopause.  Pap test February 2018 was negative, no indication to repeat this year.  Breast exam normal.  Screening mammogram October 2020 was benign.  Cologuard pending.  Health labs with family physician.  2. Postmenopause Well on no hormone replacement therapy.  No  postmenopausal bleeding.  3. Osteopenia of neck of right femur Last bone density March 2019 showed very mild osteopenia.  We will repeat a bone density at 3 years.  Vitamin D supplements, calcium intake of 1200 mg daily and regular weightbearing physical activities.  4. Class 1 obesity due to excess calories without serious comorbidity with body mass index (BMI) of 30.0 to 30.9 in adult Recommend a lower calorie/carb diet such as [employer].  Aerobic physical activities 5 times a week and light weightlifting every 2 days recommended.  Other orders - levocetirizine  (XYZAL) 5 MG tablet; Take 5 mg by mouth every evening.  Princess Bruins MD, 11:09 AM 03/30/2019

## 2019-04-01 ENCOUNTER — Encounter: Payer: Self-pay | Admitting: Obstetrics & Gynecology

## 2019-04-01 NOTE — Patient Instructions (Signed)
1. Well female exam with routine gynecological exam Normal gynecologic exam in menopause.  Pap test February 2018 was negative, no indication to repeat this year.  Breast exam normal.  Screening mammogram October 2020 was benign.  Cologuard pending.  Health labs with family physician.  2. Postmenopause Well on no hormone replacement therapy.  No postmenopausal bleeding.  3. Osteopenia of neck of right femur Last bone density March 2019 showed very mild osteopenia.  We will repeat a bone density at 3 years.  Vitamin D supplements, calcium intake of 1200 mg daily and regular weightbearing physical activities.  4. Class 1 obesity due to excess calories without serious comorbidity with body mass index (BMI) of 30.0 to 30.9 in adult Recommend a lower calorie/carb diet such as [employer].  Aerobic physical activities 5 times a week and light weightlifting every 2 days recommended.  Other orders - levocetirizine (XYZAL) 5 MG tablet; Take 5 mg by mouth every evening.  Brenda Hatfield, it was a pleasure seeing you today!

## 2019-04-02 DIAGNOSIS — J301 Allergic rhinitis due to pollen: Secondary | ICD-10-CM | POA: Diagnosis not present

## 2019-04-02 DIAGNOSIS — J3089 Other allergic rhinitis: Secondary | ICD-10-CM | POA: Diagnosis not present

## 2019-04-05 DIAGNOSIS — I10 Essential (primary) hypertension: Secondary | ICD-10-CM | POA: Diagnosis not present

## 2019-04-05 DIAGNOSIS — F411 Generalized anxiety disorder: Secondary | ICD-10-CM | POA: Diagnosis not present

## 2019-04-05 DIAGNOSIS — E119 Type 2 diabetes mellitus without complications: Secondary | ICD-10-CM | POA: Diagnosis not present

## 2019-04-05 DIAGNOSIS — E782 Mixed hyperlipidemia: Secondary | ICD-10-CM | POA: Diagnosis not present

## 2019-04-05 DIAGNOSIS — E1165 Type 2 diabetes mellitus with hyperglycemia: Secondary | ICD-10-CM | POA: Diagnosis not present

## 2019-04-10 DIAGNOSIS — J3089 Other allergic rhinitis: Secondary | ICD-10-CM | POA: Diagnosis not present

## 2019-04-10 DIAGNOSIS — J301 Allergic rhinitis due to pollen: Secondary | ICD-10-CM | POA: Diagnosis not present

## 2019-04-18 DIAGNOSIS — J3089 Other allergic rhinitis: Secondary | ICD-10-CM | POA: Diagnosis not present

## 2019-04-18 DIAGNOSIS — J301 Allergic rhinitis due to pollen: Secondary | ICD-10-CM | POA: Diagnosis not present

## 2019-04-19 DIAGNOSIS — Z0000 Encounter for general adult medical examination without abnormal findings: Secondary | ICD-10-CM | POA: Diagnosis not present

## 2019-04-19 DIAGNOSIS — Z6830 Body mass index (BMI) 30.0-30.9, adult: Secondary | ICD-10-CM | POA: Diagnosis not present

## 2019-04-19 DIAGNOSIS — E1169 Type 2 diabetes mellitus with other specified complication: Secondary | ICD-10-CM | POA: Diagnosis not present

## 2019-04-19 DIAGNOSIS — I471 Supraventricular tachycardia: Secondary | ICD-10-CM | POA: Diagnosis not present

## 2019-04-19 DIAGNOSIS — Z6831 Body mass index (BMI) 31.0-31.9, adult: Secondary | ICD-10-CM | POA: Diagnosis not present

## 2019-04-19 DIAGNOSIS — Z23 Encounter for immunization: Secondary | ICD-10-CM | POA: Diagnosis not present

## 2019-04-19 DIAGNOSIS — J302 Other seasonal allergic rhinitis: Secondary | ICD-10-CM | POA: Diagnosis not present

## 2019-04-19 DIAGNOSIS — Z712 Person consulting for explanation of examination or test findings: Secondary | ICD-10-CM | POA: Diagnosis not present

## 2019-04-19 DIAGNOSIS — H1045 Other chronic allergic conjunctivitis: Secondary | ICD-10-CM | POA: Diagnosis not present

## 2019-04-19 DIAGNOSIS — J3089 Other allergic rhinitis: Secondary | ICD-10-CM | POA: Diagnosis not present

## 2019-04-19 DIAGNOSIS — J301 Allergic rhinitis due to pollen: Secondary | ICD-10-CM | POA: Diagnosis not present

## 2019-04-19 DIAGNOSIS — D509 Iron deficiency anemia, unspecified: Secondary | ICD-10-CM | POA: Diagnosis not present

## 2019-04-19 DIAGNOSIS — F331 Major depressive disorder, recurrent, moderate: Secondary | ICD-10-CM | POA: Diagnosis not present

## 2019-04-19 DIAGNOSIS — Z713 Dietary counseling and surveillance: Secondary | ICD-10-CM | POA: Diagnosis not present

## 2019-04-25 DIAGNOSIS — J3089 Other allergic rhinitis: Secondary | ICD-10-CM | POA: Diagnosis not present

## 2019-04-25 DIAGNOSIS — J301 Allergic rhinitis due to pollen: Secondary | ICD-10-CM | POA: Diagnosis not present

## 2019-04-26 DIAGNOSIS — J302 Other seasonal allergic rhinitis: Secondary | ICD-10-CM | POA: Diagnosis not present

## 2019-04-26 DIAGNOSIS — D509 Iron deficiency anemia, unspecified: Secondary | ICD-10-CM | POA: Diagnosis not present

## 2019-04-26 DIAGNOSIS — M8580 Other specified disorders of bone density and structure, unspecified site: Secondary | ICD-10-CM | POA: Diagnosis not present

## 2019-04-26 DIAGNOSIS — I10 Essential (primary) hypertension: Secondary | ICD-10-CM | POA: Diagnosis not present

## 2019-04-26 DIAGNOSIS — E6609 Other obesity due to excess calories: Secondary | ICD-10-CM | POA: Diagnosis not present

## 2019-04-26 DIAGNOSIS — F331 Major depressive disorder, recurrent, moderate: Secondary | ICD-10-CM | POA: Diagnosis not present

## 2019-05-02 DIAGNOSIS — J3089 Other allergic rhinitis: Secondary | ICD-10-CM | POA: Diagnosis not present

## 2019-05-02 DIAGNOSIS — J301 Allergic rhinitis due to pollen: Secondary | ICD-10-CM | POA: Diagnosis not present

## 2019-05-09 DIAGNOSIS — J3089 Other allergic rhinitis: Secondary | ICD-10-CM | POA: Diagnosis not present

## 2019-05-09 DIAGNOSIS — J301 Allergic rhinitis due to pollen: Secondary | ICD-10-CM | POA: Diagnosis not present

## 2019-05-11 DIAGNOSIS — D509 Iron deficiency anemia, unspecified: Secondary | ICD-10-CM | POA: Diagnosis not present

## 2019-05-11 DIAGNOSIS — J302 Other seasonal allergic rhinitis: Secondary | ICD-10-CM | POA: Diagnosis not present

## 2019-05-11 DIAGNOSIS — F331 Major depressive disorder, recurrent, moderate: Secondary | ICD-10-CM | POA: Diagnosis not present

## 2019-05-11 DIAGNOSIS — I10 Essential (primary) hypertension: Secondary | ICD-10-CM | POA: Diagnosis not present

## 2019-05-11 DIAGNOSIS — E6609 Other obesity due to excess calories: Secondary | ICD-10-CM | POA: Diagnosis not present

## 2019-05-11 DIAGNOSIS — M8580 Other specified disorders of bone density and structure, unspecified site: Secondary | ICD-10-CM | POA: Diagnosis not present

## 2019-05-16 DIAGNOSIS — J301 Allergic rhinitis due to pollen: Secondary | ICD-10-CM | POA: Diagnosis not present

## 2019-05-16 DIAGNOSIS — J3089 Other allergic rhinitis: Secondary | ICD-10-CM | POA: Diagnosis not present

## 2019-05-28 DIAGNOSIS — J301 Allergic rhinitis due to pollen: Secondary | ICD-10-CM | POA: Diagnosis not present

## 2019-05-28 DIAGNOSIS — J3089 Other allergic rhinitis: Secondary | ICD-10-CM | POA: Diagnosis not present

## 2019-05-30 ENCOUNTER — Encounter: Payer: Self-pay | Admitting: Cardiovascular Disease

## 2019-05-30 ENCOUNTER — Other Ambulatory Visit: Payer: Self-pay

## 2019-05-30 ENCOUNTER — Ambulatory Visit: Payer: PPO | Admitting: Cardiovascular Disease

## 2019-05-30 VITALS — BP 102/62 | HR 82 | Ht 63.0 in | Wt 174.4 lb

## 2019-05-30 DIAGNOSIS — I471 Supraventricular tachycardia, unspecified: Secondary | ICD-10-CM

## 2019-05-30 DIAGNOSIS — E669 Obesity, unspecified: Secondary | ICD-10-CM

## 2019-05-30 DIAGNOSIS — E1169 Type 2 diabetes mellitus with other specified complication: Secondary | ICD-10-CM | POA: Diagnosis not present

## 2019-05-30 DIAGNOSIS — E119 Type 2 diabetes mellitus without complications: Secondary | ICD-10-CM

## 2019-05-30 DIAGNOSIS — R55 Syncope and collapse: Secondary | ICD-10-CM | POA: Diagnosis not present

## 2019-05-30 DIAGNOSIS — E782 Mixed hyperlipidemia: Secondary | ICD-10-CM

## 2019-05-30 MED ORDER — METOPROLOL SUCCINATE ER 25 MG PO TB24: 12.5000 mg | ORAL_TABLET | Freq: Every day | ORAL | 3 refills | Status: DC

## 2019-05-30 NOTE — Patient Instructions (Signed)
Medication Instructions:  DECREASE the Metoprolol to 12.5 mg (half a tablet) once daily.  *If you need a refill on your cardiac medications before your next appointment, please call your pharmacy*  Lab Work: None ordered If you have labs (blood work) drawn today and your tests are completely normal, you will receive your results only by: [name] MyChart Message (if you have MyChart) OR . A paper copy in the mail If you have any lab test that is abnormal or we need to change your treatment, we will call you to review the results.  Testing/Procedures: None ordered  Follow-Up: At [facility], you and your health needs are our priority.  As part of our continuing mission to provide you with exceptional heart care, we have created designated Provider Care Teams.  These Care Teams include your primary Cardiologist (physician) and Advanced Practice Providers (APPs -  Physician Assistants and Nurse Practitioners) who all work together to provide you with the care you need, when you need it.  Your next appointment:   12 month(s)  The format for your next appointment:   In Person  Provider:   You may see Sanda Klein, MD or one of the following Advanced Practice Providers on your designated Care Team:    Almyra Deforest, PA-C  Fabian Sharp, PA-C or   Roby [place], [place]

## 2019-05-30 NOTE — Progress Notes (Signed)
Cardiology Office Note    Date:  06/06/2019   ID:  Brenda Hatfield, DOB 1946-01-09, MRN [id number]  PCP:  Celene Squibb, MD  Cardiologist:   Sanda Klein, MD   Chief Complaint  Patient presents with  . Loss of Consciousness  . Irregular Heart Beat    History of Present Illness:  Brenda Hatfield is a 74 y.o. female returns for follow-up for a history of type 2 diabetes mellitus, dyslipidemia, obesity, supraventricular tachycardia and neurally mediated syncope.  Her blood pressure is quite low today.  She has had 2 episodes of syncope, 1 of which occurred while she was getting a pedicure.  Neither 1 was associated with falls or injuries and both of them had significant prodrome consistent with vasovagal events.  She did not have palpitations with either episode.  Both were very brief and she recovered fully quickly.  She does not have systemic hypertension but has been prescribed lisinopril for protection from diabetic nephropathy.  She is also taking Jardiance which may be lowering her blood pressure as well.  Her blood pressure today was 102/62 and she reports that at home her systolic blood pressure is almost always around the same level.  Glycemic control has been good but not ideal with an A1c of 7.4% last November.  Her lipid parameters are good with an LDL that has been consistently less than 100.  The patient specifically denies any chest pain at rest exertion, dyspnea at rest or with exertion, orthopnea, paroxysmal nocturnal dyspnea,  palpitations, focal neurological deficits, intermittent claudication, lower extremity edema, unexplained weight gain, cough, hemoptysis or wheezing.  She has a lifelong history of vasovagal events which she can usually abort by sitting down or lying down.  A nuclear stress test in 2012 showed breast attenuation artifact, but was otherwise normal. An echocardiogram in 2001 was a mostly normal study: Systolic function, [id number], very mild left ventricular  hypertrophy. There was mild mitral annular calcification and trivial MR, aortic valve sclerosis without stenosis which likely explains cardiac murmur. The left atrium was mildly enlarged at 4.5 cm.   Past Medical History:  Diagnosis Date  . Chest pain 09/16/2010   normal myocardial perfusion study EF=61%  . Diabetes mellitus ([place])    type 2  . GERD (gastroesophageal reflux disease)   . History of cardiac murmur 02/23/00   2D Echo EF greater than 55%  . Hyperlipemia   . Obesity   . Osteopenia 06/2017   T score -1.2 FRAX 9% / 1.1%  . Palpitation   . Seasonal allergies   . SVT (supraventricular tachycardia) ([place])   . Systemic hypertension     Past Surgical History:  Procedure Laterality Date  . CATARACT EXTRACTION Right 2003   [place]  . CATARACT EXTRACTION W/PHACO Left 04/09/2013   Procedure: LEFT EYE CATARACT EXTRACTION PHACO AND INTRAOCULAR LENS PLACEMENT ;  Surgeon: Tonny Branch, MD;  Location: [facility];  Service: Ophthalmology;  Laterality: Left;  CDE 10.78  . MOUTH SURGERY  04/2017   spot removed by Dr. Buelah Manis   . TONSILLECTOMY  [phone]  . TUBAL LIGATION  1980    Current Medications: Outpatient Medications Prior to Visit  Medication Sig Dispense Refill  . aspirin 81 MG tablet Take 81 mg by mouth daily.    [name] atorvastatin (LIPITOR) 80 MG tablet Take 80 mg by mouth daily.    . empagliflozin (JARDIANCE) 10 MG TABS tablet Take 10 mg by mouth daily.    [name] glucose 4  GM chewable tablet Chew 0.5 tablets by mouth as needed for low blood sugar.    . Insulin Degludec-Liraglutide (XULTOPHY) 100-3.6 UNIT-MG/ML SOPN Inject 28-32 Units into the skin daily.    [name] levocetirizine (XYZAL) 5 MG tablet Take 5 mg by mouth every evening.    [name] lisinopril (PRINIVIL,ZESTRIL) 5 MG tablet Take 5 mg by mouth daily.    . metFORMIN (GLUCOPHAGE) 1000 MG tablet Take 1,000 mg by mouth 2 (two) times daily with a meal.    . Multiple Vitamin (MULTIVITAMIN) capsule Take 1 capsule by mouth daily.    [name]  omeprazole (PRILOSEC) 20 MG capsule Take 20 mg by mouth every other day.     . metoprolol succinate (TOPROL-XL) 25 MG 24 hr tablet Take 25 mg by mouth daily.     No facility-administered medications prior to visit.     Allergies:   Patient has no known allergies.   Social History   Socioeconomic History  . Marital status: Married    Spouse name: Not on file  . Number of children: Not on file  . Years of education: Not on file  . Highest education level: Not on file  Occupational History  . Not on file  Tobacco Use  . Smoking status: Never Smoker  . Smokeless tobacco: Never Used  Substance and Sexual Activity  . Alcohol use: No  . Drug use: No  . Sexual activity: Yes    Partners: Male    Birth control/protection: None    Comment: First sexual intercouse at 74 yrs old. Less than partnere in a life time.   Other Topics Concern  . Not on file  Social History Narrative  . Not on file   Social Determinants of Health   Financial Resource Strain:   . Difficulty of Paying Living Expenses: Not on file  Food Insecurity:   . Worried About Charity fundraiser in the Last Year: Not on file  . Ran Out of Food in the Last Year: Not on file  Transportation Needs:   . Lack of Transportation (Medical): Not on file  . Lack of Transportation (Non-Medical): Not on file  Physical Activity:   . Days of Exercise per Week: Not on file  . Minutes of Exercise per Session: Not on file  Stress:   . Feeling of Stress : Not on file  Social Connections:   . Frequency of Communication with Friends and Family: Not on file  . Frequency of Social Gatherings with Friends and Family: Not on file  . Attends Religious Services: Not on file  . Active Member of Clubs or Organizations: Not on file  . Attends Archivist Meetings: Not on file  . Marital Status: Not on file     Family History:  The patient's family history includes Diabetes in her father; Parkinson's disease in her father.    ROS:   Please see the history of present illness.    ROS All other systems reviewed and are negative.   PHYSICAL EXAM:   VS:  BP 102/62   Pulse 82   Ht 5\' 3"  (1.6 m)   Wt 174 lb 6.4 oz (79.1 kg)   BMI 30.89 kg/m      General: Alert, oriented x3, no distress, mildly obese Head: no evidence of trauma, PERRL, EOMI, no exophtalmos or lid lag, no myxedema, no xanthelasma; normal ears, nose and oropharynx Neck: normal jugular venous pulsations and no hepatojugular reflux; brisk carotid pulses without delay and no carotid  bruits Chest: clear to auscultation, no signs of consolidation by percussion or palpation, normal fremitus, symmetrical and full respiratory excursions Cardiovascular: normal position and quality of the apical impulse, regular rhythm, normal first and second heart sounds, no murmurs, rubs or gallops Abdomen: no tenderness or distention, no masses by palpation, no abnormal pulsatility or arterial bruits, normal bowel sounds, no hepatosplenomegaly Extremities: no clubbing, cyanosis or edema; 2+ radial, ulnar and brachial pulses bilaterally; 2+ right femoral, posterior tibial and dorsalis pedis pulses; 2+ left femoral, posterior tibial and dorsalis pedis pulses; no subclavian or femoral bruits Neurological: grossly nonfocal Psych: Normal mood and affect   Wt Readings from Last 3 Encounters:  05/30/19 174 lb 6.4 oz (79.1 kg)  03/30/19 173 lb (78.5 kg)  04/21/18 181 lb (82.1 kg)      Studies/Labs Reviewed:   EKG:  EKG is ordered today.  It shows normal sinus rhythm, and is a normal tracing  Recent Labs: No results found for requested labs within last 8760 hours.   Lipid Panel    Component Value Date/Time   CHOL 189 03/11/2008 2045   TRIG 180 (H) 03/11/2008 2045   HDL 69 03/11/2008 2045   CHOLHDL 2.7 Ratio 03/11/2008 2045   VLDL 36 03/11/2008 2045   LDLCALC 84 03/11/2008 2045      ASSESSMENT:    1. SVT (supraventricular tachycardia) ([place])   2.  Neurocardiogenic syncope   3. Diabetes mellitus type 2 in obese ([place])   4. Mixed hyperlipidemia   5. Mild obesity      PLAN:  In order of problems listed above:  1. Syncope: New syncopal events in the last 12 months, both of them consistent with vasovagal episodes.  Taking Jardiance for diabetes is a [place] idea but may make her more prone to hypovolemia and syncope.  Asked her to make sure that she drinks plenty of fluids.  We will cut back on her beta-blocker dose. 2. DM: Glycemic control is still not ideal but not terrible.  On lisinopril to protect from diabetic nephropathy. 3. SVT: She has not had symptomatic recurrence recently, hopefully will not see any events after we cut back her beta-blocker.  If SVT does recur, may have to decide to stop the lisinopril instead, her diabetes not withstanding. 4. HLP: LDL is a little higher than it was last year.  She does not have known CAD or PAD but nevertheless ideally we will get that LDL back less than 70.  It seems to improve in parallel with her hemoglobin A1c. 5. Obesity: After initial weight loss with Jardiance and liraglutide, her weight has stabilized in the mildly obese range.  Current BMI right around 31.   Medication Adjustments/Labs and Tests Ordered: Current medicines are reviewed at length with the patient today.  Concerns regarding medicines are outlined above.  Medication changes, Labs and Tests ordered today are listed in the Patient Instructions below. Patient Instructions  Medication Instructions:  DECREASE the Metoprolol to 12.5 mg (half a tablet) once daily.  *If you need a refill on your cardiac medications before your next appointment, please call your pharmacy*  Lab Work: None ordered If you have labs (blood work) drawn today and your tests are completely normal, you will receive your results only by: [name] MyChart Message (if you have MyChart) OR . A paper copy in the mail If you have any lab test that is abnormal  or we need to change your treatment, we will call you to review the results.  Testing/Procedures: None ordered  Follow-Up: At [facility], you and your health needs are our priority.  As part of our continuing mission to provide you with exceptional heart care, we have created designated Provider Care Teams.  These Care Teams include your primary Cardiologist (physician) and Advanced Practice Providers (APPs -  Physician Assistants and Nurse Practitioners) who all work together to provide you with the care you need, when you need it.  Your next appointment:   12 month(s)  The format for your next appointment:   In Person  Provider:   You may see Sanda Klein, MD or one of the following Advanced Practice Providers on your designated Care Team:    Almyra Deforest, PA-C  Fabian Sharp, [place] or   Roby Lofts, PA-C      Signed, Sanda Klein, MD  06/06/2019 6:51 PM    [address], [place], [place]  [address] Phone: [phone]; Fax: [phone]

## 2019-06-04 DIAGNOSIS — J301 Allergic rhinitis due to pollen: Secondary | ICD-10-CM | POA: Diagnosis not present

## 2019-06-04 DIAGNOSIS — J3089 Other allergic rhinitis: Secondary | ICD-10-CM | POA: Diagnosis not present

## 2019-06-06 ENCOUNTER — Encounter: Payer: Self-pay | Admitting: Cardiovascular Disease

## 2019-06-06 DIAGNOSIS — J301 Allergic rhinitis due to pollen: Secondary | ICD-10-CM | POA: Diagnosis not present

## 2019-06-06 DIAGNOSIS — J3089 Other allergic rhinitis: Secondary | ICD-10-CM | POA: Diagnosis not present

## 2019-06-11 DIAGNOSIS — J301 Allergic rhinitis due to pollen: Secondary | ICD-10-CM | POA: Diagnosis not present

## 2019-06-11 DIAGNOSIS — J3089 Other allergic rhinitis: Secondary | ICD-10-CM | POA: Diagnosis not present

## 2019-06-14 DIAGNOSIS — J301 Allergic rhinitis due to pollen: Secondary | ICD-10-CM | POA: Diagnosis not present

## 2019-06-14 DIAGNOSIS — J3089 Other allergic rhinitis: Secondary | ICD-10-CM | POA: Diagnosis not present

## 2019-06-18 DIAGNOSIS — J3089 Other allergic rhinitis: Secondary | ICD-10-CM | POA: Diagnosis not present

## 2019-06-18 DIAGNOSIS — J301 Allergic rhinitis due to pollen: Secondary | ICD-10-CM | POA: Diagnosis not present

## 2019-06-28 DIAGNOSIS — J301 Allergic rhinitis due to pollen: Secondary | ICD-10-CM | POA: Diagnosis not present

## 2019-06-28 DIAGNOSIS — J3089 Other allergic rhinitis: Secondary | ICD-10-CM | POA: Diagnosis not present

## 2019-07-05 DIAGNOSIS — J3089 Other allergic rhinitis: Secondary | ICD-10-CM | POA: Diagnosis not present

## 2019-07-05 DIAGNOSIS — J301 Allergic rhinitis due to pollen: Secondary | ICD-10-CM | POA: Diagnosis not present

## 2019-07-09 DIAGNOSIS — J3089 Other allergic rhinitis: Secondary | ICD-10-CM | POA: Diagnosis not present

## 2019-07-09 DIAGNOSIS — J301 Allergic rhinitis due to pollen: Secondary | ICD-10-CM | POA: Diagnosis not present

## 2019-07-09 DIAGNOSIS — H1045 Other chronic allergic conjunctivitis: Secondary | ICD-10-CM | POA: Diagnosis not present

## 2019-07-16 DIAGNOSIS — J301 Allergic rhinitis due to pollen: Secondary | ICD-10-CM | POA: Diagnosis not present

## 2019-07-16 DIAGNOSIS — J3089 Other allergic rhinitis: Secondary | ICD-10-CM | POA: Diagnosis not present

## 2019-07-23 DIAGNOSIS — J3089 Other allergic rhinitis: Secondary | ICD-10-CM | POA: Diagnosis not present

## 2019-07-23 DIAGNOSIS — J301 Allergic rhinitis due to pollen: Secondary | ICD-10-CM | POA: Diagnosis not present

## 2019-07-30 IMAGING — DX DG SHOULDER 2+V*R*
2 series · 2 of 2 positions shown · non-contrast
Comparison: Chest radiographs 09/27/2016.

CLINICAL DATA: 71-year-old female status post fall.

EXAM:
RIGHT SHOULDER - 2+ VIEW

[shoulder grashey]
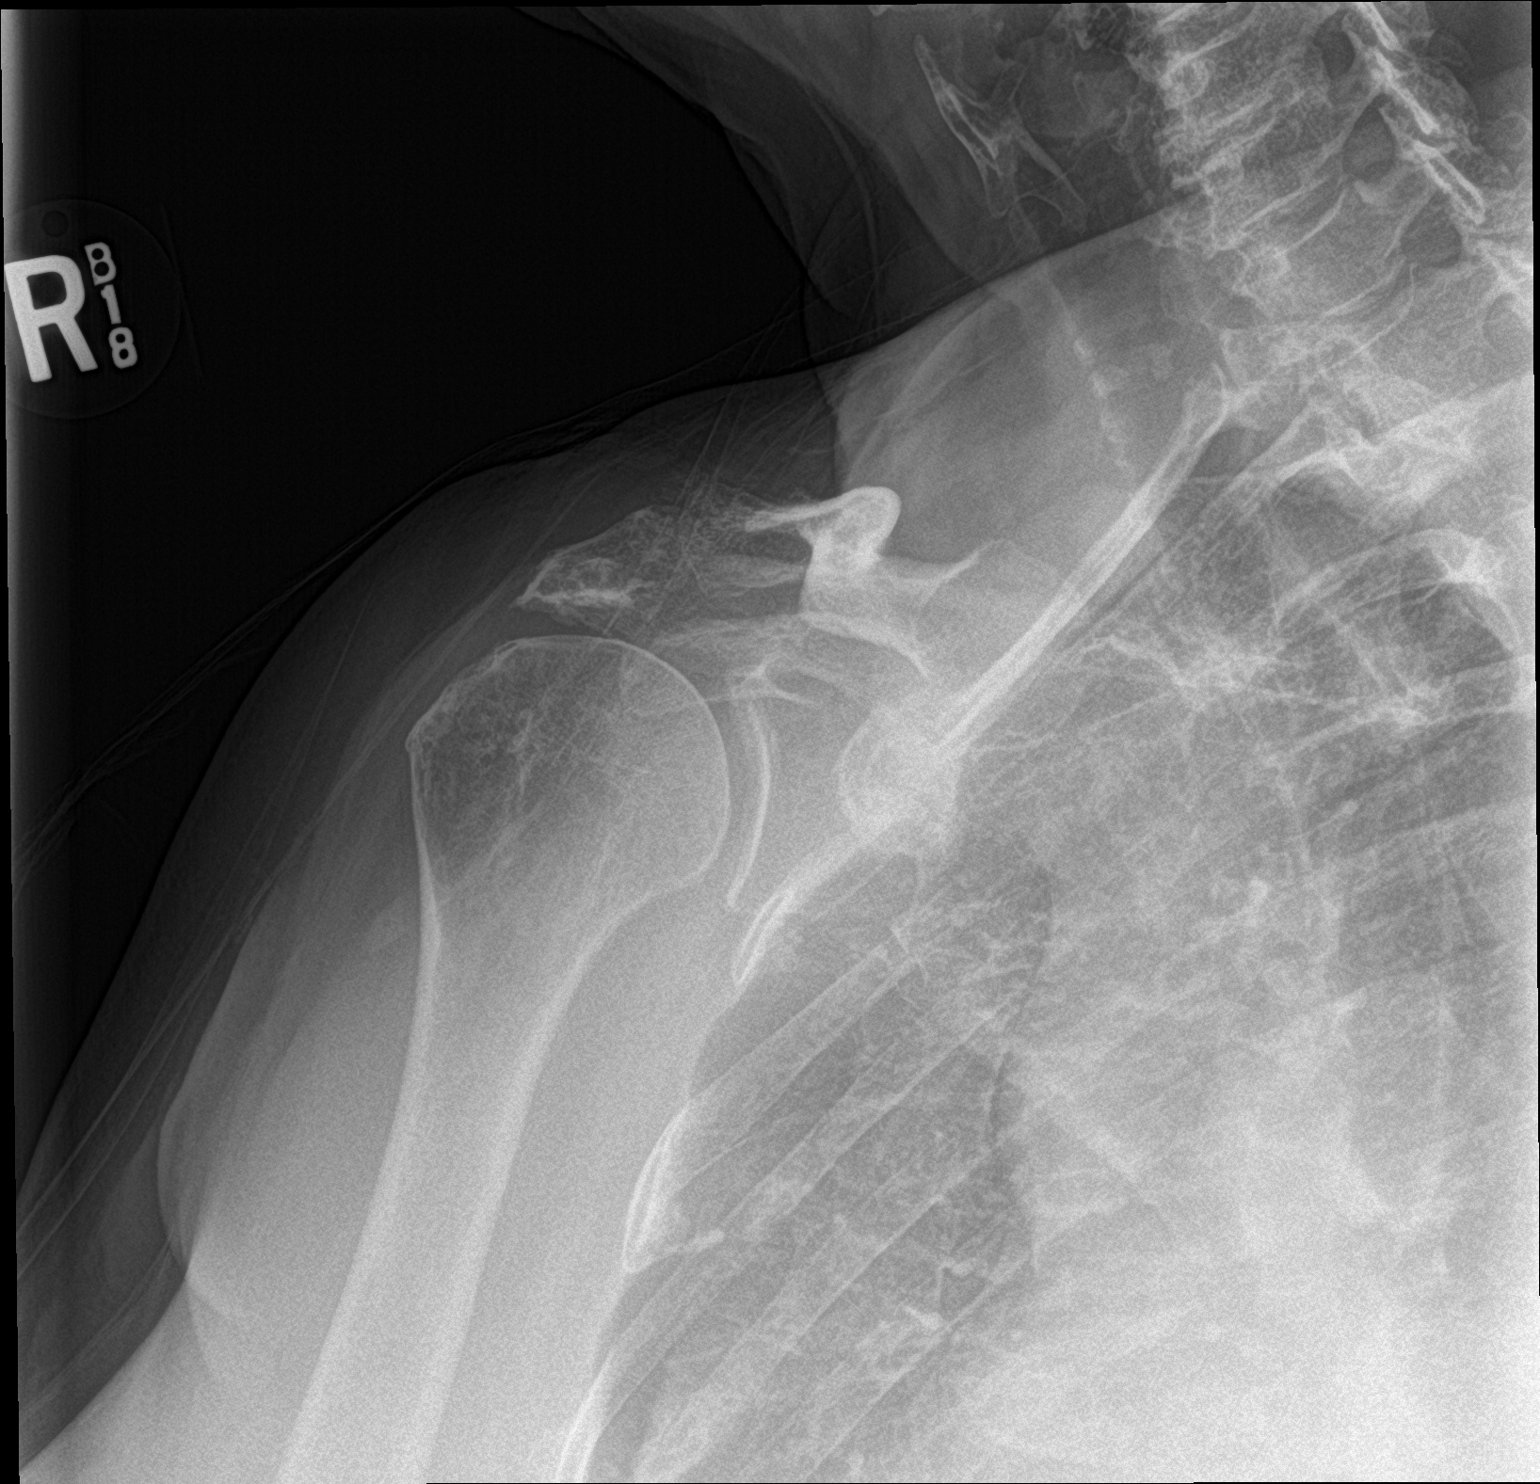

[shoulder y view]
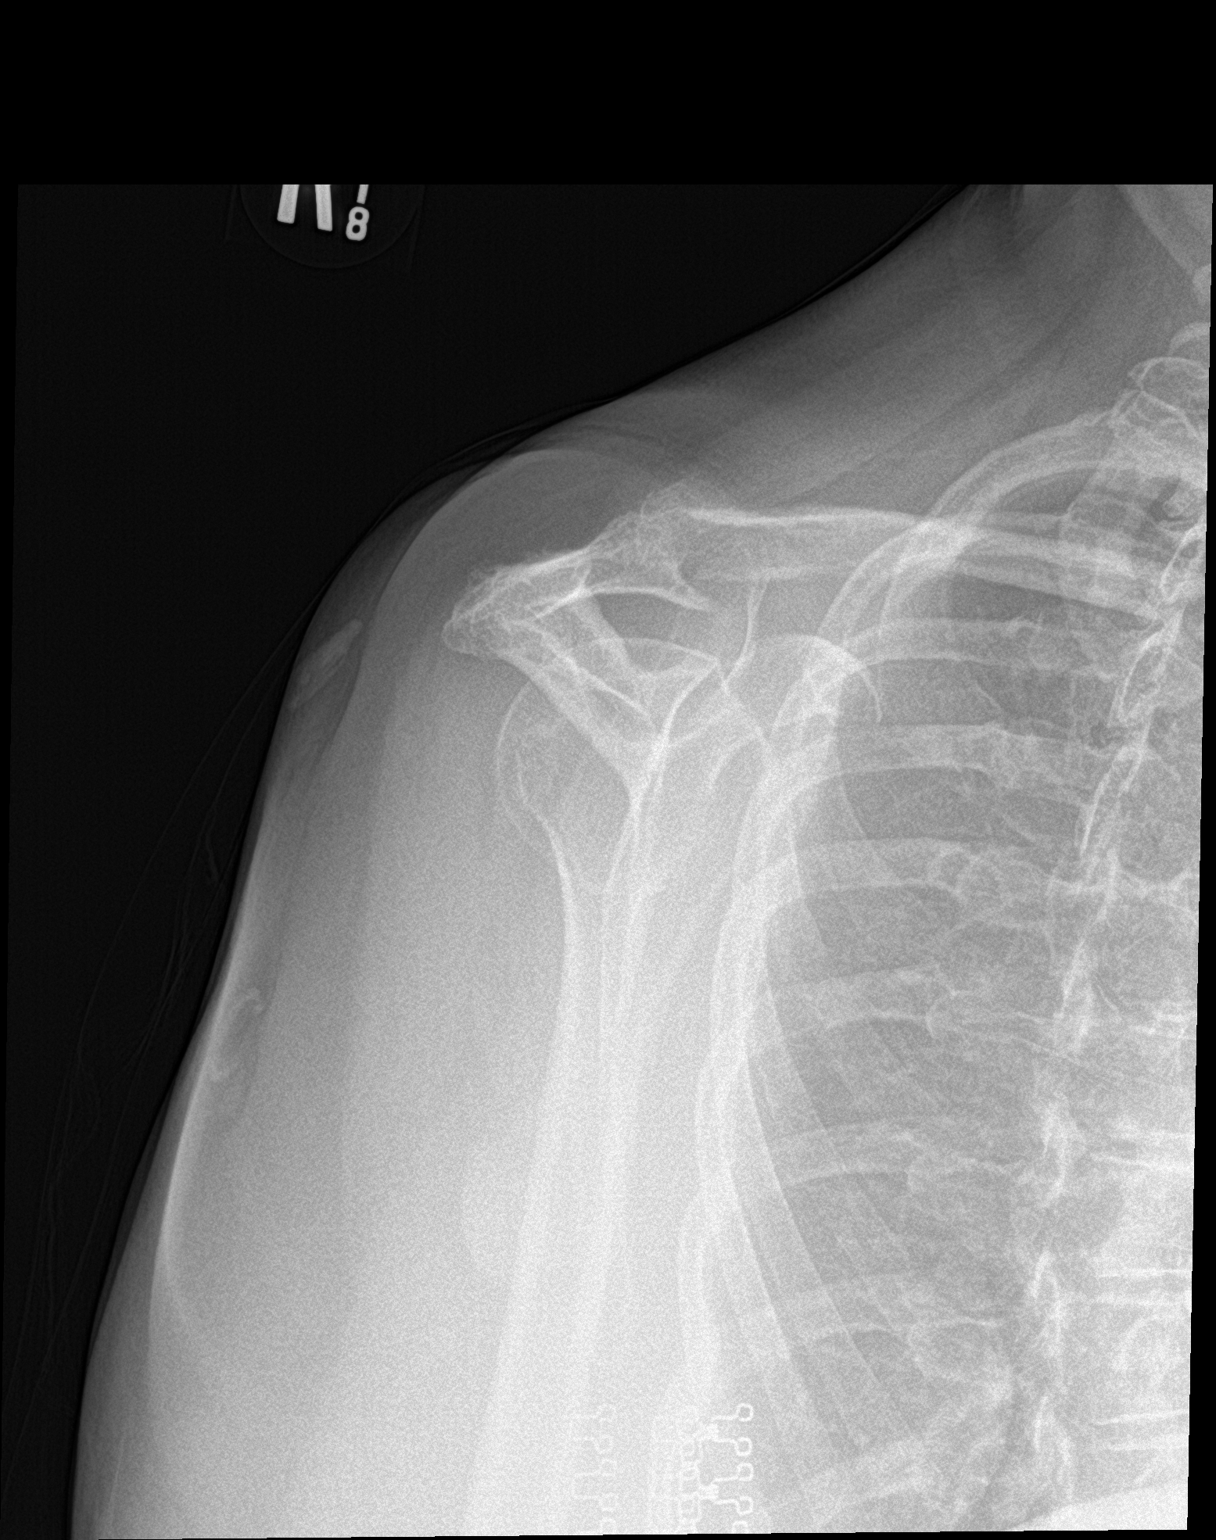

[2 of 2 positions shown; findings below may reference images not displayed]

FINDINGS: Grashey and scapular Y-views only of the right shoulder. The patient
would not tolerate an axillary view.

No glenohumeral joint dislocation. The proximal right humerus
appears intact. No right clavicle or scapula fracture is evident.
Visible right ribs and lung parenchyma appear stable.
IMPRESSION: No acute fracture or dislocation identified about the right
shoulder.

## 2019-07-31 DIAGNOSIS — J3089 Other allergic rhinitis: Secondary | ICD-10-CM | POA: Diagnosis not present

## 2019-07-31 DIAGNOSIS — J301 Allergic rhinitis due to pollen: Secondary | ICD-10-CM | POA: Diagnosis not present

## 2019-08-07 DIAGNOSIS — H1030 Unspecified acute conjunctivitis, unspecified eye: Secondary | ICD-10-CM | POA: Diagnosis not present

## 2019-08-07 DIAGNOSIS — I10 Essential (primary) hypertension: Secondary | ICD-10-CM | POA: Diagnosis not present

## 2019-08-07 DIAGNOSIS — J301 Allergic rhinitis due to pollen: Secondary | ICD-10-CM | POA: Diagnosis not present

## 2019-08-07 DIAGNOSIS — E782 Mixed hyperlipidemia: Secondary | ICD-10-CM | POA: Diagnosis not present

## 2019-08-07 DIAGNOSIS — F331 Major depressive disorder, recurrent, moderate: Secondary | ICD-10-CM | POA: Diagnosis not present

## 2019-08-07 DIAGNOSIS — J3089 Other allergic rhinitis: Secondary | ICD-10-CM | POA: Diagnosis not present

## 2019-08-07 DIAGNOSIS — H1045 Other chronic allergic conjunctivitis: Secondary | ICD-10-CM | POA: Diagnosis not present

## 2019-08-07 DIAGNOSIS — I471 Supraventricular tachycardia: Secondary | ICD-10-CM | POA: Diagnosis not present

## 2019-08-07 DIAGNOSIS — E1165 Type 2 diabetes mellitus with hyperglycemia: Secondary | ICD-10-CM | POA: Diagnosis not present

## 2019-08-07 DIAGNOSIS — D509 Iron deficiency anemia, unspecified: Secondary | ICD-10-CM | POA: Diagnosis not present

## 2019-08-07 DIAGNOSIS — F411 Generalized anxiety disorder: Secondary | ICD-10-CM | POA: Diagnosis not present

## 2019-08-07 DIAGNOSIS — E119 Type 2 diabetes mellitus without complications: Secondary | ICD-10-CM | POA: Diagnosis not present

## 2019-08-07 DIAGNOSIS — E1169 Type 2 diabetes mellitus with other specified complication: Secondary | ICD-10-CM | POA: Diagnosis not present

## 2019-08-07 DIAGNOSIS — E6609 Other obesity due to excess calories: Secondary | ICD-10-CM | POA: Diagnosis not present

## 2019-08-09 DIAGNOSIS — E1169 Type 2 diabetes mellitus with other specified complication: Secondary | ICD-10-CM | POA: Diagnosis not present

## 2019-08-09 DIAGNOSIS — E782 Mixed hyperlipidemia: Secondary | ICD-10-CM | POA: Diagnosis not present

## 2019-08-09 DIAGNOSIS — M8580 Other specified disorders of bone density and structure, unspecified site: Secondary | ICD-10-CM | POA: Diagnosis not present

## 2019-08-09 DIAGNOSIS — E6609 Other obesity due to excess calories: Secondary | ICD-10-CM | POA: Diagnosis not present

## 2019-08-09 DIAGNOSIS — J302 Other seasonal allergic rhinitis: Secondary | ICD-10-CM | POA: Diagnosis not present

## 2019-08-09 DIAGNOSIS — J301 Allergic rhinitis due to pollen: Secondary | ICD-10-CM | POA: Diagnosis not present

## 2019-08-09 DIAGNOSIS — I10 Essential (primary) hypertension: Secondary | ICD-10-CM | POA: Diagnosis not present

## 2019-08-09 DIAGNOSIS — Z0001 Encounter for general adult medical examination with abnormal findings: Secondary | ICD-10-CM | POA: Diagnosis not present

## 2019-08-09 DIAGNOSIS — D509 Iron deficiency anemia, unspecified: Secondary | ICD-10-CM | POA: Diagnosis not present

## 2019-08-09 DIAGNOSIS — F331 Major depressive disorder, recurrent, moderate: Secondary | ICD-10-CM | POA: Diagnosis not present

## 2019-08-13 DIAGNOSIS — M8580 Other specified disorders of bone density and structure, unspecified site: Secondary | ICD-10-CM | POA: Diagnosis not present

## 2019-08-13 DIAGNOSIS — E6609 Other obesity due to excess calories: Secondary | ICD-10-CM | POA: Diagnosis not present

## 2019-08-13 DIAGNOSIS — D509 Iron deficiency anemia, unspecified: Secondary | ICD-10-CM | POA: Diagnosis not present

## 2019-08-13 DIAGNOSIS — I10 Essential (primary) hypertension: Secondary | ICD-10-CM | POA: Diagnosis not present

## 2019-08-13 DIAGNOSIS — J302 Other seasonal allergic rhinitis: Secondary | ICD-10-CM | POA: Diagnosis not present

## 2019-08-13 DIAGNOSIS — F331 Major depressive disorder, recurrent, moderate: Secondary | ICD-10-CM | POA: Diagnosis not present

## 2019-08-15 DIAGNOSIS — J3089 Other allergic rhinitis: Secondary | ICD-10-CM | POA: Diagnosis not present

## 2019-08-15 DIAGNOSIS — J301 Allergic rhinitis due to pollen: Secondary | ICD-10-CM | POA: Diagnosis not present

## 2019-08-22 DIAGNOSIS — J301 Allergic rhinitis due to pollen: Secondary | ICD-10-CM | POA: Diagnosis not present

## 2019-08-22 DIAGNOSIS — J3089 Other allergic rhinitis: Secondary | ICD-10-CM | POA: Diagnosis not present

## 2019-08-30 DIAGNOSIS — J301 Allergic rhinitis due to pollen: Secondary | ICD-10-CM | POA: Diagnosis not present

## 2019-08-30 DIAGNOSIS — J3089 Other allergic rhinitis: Secondary | ICD-10-CM | POA: Diagnosis not present

## 2019-09-05 DIAGNOSIS — J3089 Other allergic rhinitis: Secondary | ICD-10-CM | POA: Diagnosis not present

## 2019-09-05 DIAGNOSIS — J301 Allergic rhinitis due to pollen: Secondary | ICD-10-CM | POA: Diagnosis not present

## 2019-09-12 DIAGNOSIS — J3089 Other allergic rhinitis: Secondary | ICD-10-CM | POA: Diagnosis not present

## 2019-09-12 DIAGNOSIS — J301 Allergic rhinitis due to pollen: Secondary | ICD-10-CM | POA: Diagnosis not present

## 2019-09-19 DIAGNOSIS — J301 Allergic rhinitis due to pollen: Secondary | ICD-10-CM | POA: Diagnosis not present

## 2019-09-19 DIAGNOSIS — J3089 Other allergic rhinitis: Secondary | ICD-10-CM | POA: Diagnosis not present

## 2019-09-25 DIAGNOSIS — J3089 Other allergic rhinitis: Secondary | ICD-10-CM | POA: Diagnosis not present

## 2019-09-25 DIAGNOSIS — J301 Allergic rhinitis due to pollen: Secondary | ICD-10-CM | POA: Diagnosis not present

## 2019-10-02 DIAGNOSIS — F331 Major depressive disorder, recurrent, moderate: Secondary | ICD-10-CM | POA: Diagnosis not present

## 2019-10-02 DIAGNOSIS — D509 Iron deficiency anemia, unspecified: Secondary | ICD-10-CM | POA: Diagnosis not present

## 2019-10-02 DIAGNOSIS — J302 Other seasonal allergic rhinitis: Secondary | ICD-10-CM | POA: Diagnosis not present

## 2019-10-02 DIAGNOSIS — M8580 Other specified disorders of bone density and structure, unspecified site: Secondary | ICD-10-CM | POA: Diagnosis not present

## 2019-10-02 DIAGNOSIS — I10 Essential (primary) hypertension: Secondary | ICD-10-CM | POA: Diagnosis not present

## 2019-10-02 DIAGNOSIS — E6609 Other obesity due to excess calories: Secondary | ICD-10-CM | POA: Diagnosis not present

## 2019-10-04 DIAGNOSIS — J301 Allergic rhinitis due to pollen: Secondary | ICD-10-CM | POA: Diagnosis not present

## 2019-10-04 DIAGNOSIS — J3089 Other allergic rhinitis: Secondary | ICD-10-CM | POA: Diagnosis not present

## 2019-10-12 DIAGNOSIS — J3089 Other allergic rhinitis: Secondary | ICD-10-CM | POA: Diagnosis not present

## 2019-10-12 DIAGNOSIS — J301 Allergic rhinitis due to pollen: Secondary | ICD-10-CM | POA: Diagnosis not present

## 2019-10-19 DIAGNOSIS — J301 Allergic rhinitis due to pollen: Secondary | ICD-10-CM | POA: Diagnosis not present

## 2019-10-19 DIAGNOSIS — J3089 Other allergic rhinitis: Secondary | ICD-10-CM | POA: Diagnosis not present

## 2019-10-25 DIAGNOSIS — J3089 Other allergic rhinitis: Secondary | ICD-10-CM | POA: Diagnosis not present

## 2019-10-25 DIAGNOSIS — J301 Allergic rhinitis due to pollen: Secondary | ICD-10-CM | POA: Diagnosis not present

## 2019-10-26 DIAGNOSIS — J301 Allergic rhinitis due to pollen: Secondary | ICD-10-CM | POA: Diagnosis not present

## 2019-10-26 DIAGNOSIS — J3089 Other allergic rhinitis: Secondary | ICD-10-CM | POA: Diagnosis not present

## 2019-11-02 DIAGNOSIS — J301 Allergic rhinitis due to pollen: Secondary | ICD-10-CM | POA: Diagnosis not present

## 2019-11-02 DIAGNOSIS — J3089 Other allergic rhinitis: Secondary | ICD-10-CM | POA: Diagnosis not present

## 2019-11-07 DIAGNOSIS — I10 Essential (primary) hypertension: Secondary | ICD-10-CM | POA: Diagnosis not present

## 2019-11-07 DIAGNOSIS — D509 Iron deficiency anemia, unspecified: Secondary | ICD-10-CM | POA: Diagnosis not present

## 2019-11-07 DIAGNOSIS — M8580 Other specified disorders of bone density and structure, unspecified site: Secondary | ICD-10-CM | POA: Diagnosis not present

## 2019-11-07 DIAGNOSIS — J302 Other seasonal allergic rhinitis: Secondary | ICD-10-CM | POA: Diagnosis not present

## 2019-11-07 DIAGNOSIS — E6609 Other obesity due to excess calories: Secondary | ICD-10-CM | POA: Diagnosis not present

## 2019-11-07 DIAGNOSIS — F331 Major depressive disorder, recurrent, moderate: Secondary | ICD-10-CM | POA: Diagnosis not present

## 2019-11-09 DIAGNOSIS — J3089 Other allergic rhinitis: Secondary | ICD-10-CM | POA: Diagnosis not present

## 2019-11-09 DIAGNOSIS — J301 Allergic rhinitis due to pollen: Secondary | ICD-10-CM | POA: Diagnosis not present

## 2019-11-13 ENCOUNTER — Other Ambulatory Visit: Payer: Self-pay

## 2019-11-16 DIAGNOSIS — J301 Allergic rhinitis due to pollen: Secondary | ICD-10-CM | POA: Diagnosis not present

## 2019-11-16 DIAGNOSIS — J3089 Other allergic rhinitis: Secondary | ICD-10-CM | POA: Diagnosis not present

## 2019-11-23 DIAGNOSIS — J3089 Other allergic rhinitis: Secondary | ICD-10-CM | POA: Diagnosis not present

## 2019-11-23 DIAGNOSIS — J301 Allergic rhinitis due to pollen: Secondary | ICD-10-CM | POA: Diagnosis not present

## 2019-11-26 DIAGNOSIS — J3089 Other allergic rhinitis: Secondary | ICD-10-CM | POA: Diagnosis not present

## 2019-11-26 DIAGNOSIS — J301 Allergic rhinitis due to pollen: Secondary | ICD-10-CM | POA: Diagnosis not present

## 2019-11-30 DIAGNOSIS — J301 Allergic rhinitis due to pollen: Secondary | ICD-10-CM | POA: Diagnosis not present

## 2019-11-30 DIAGNOSIS — J3089 Other allergic rhinitis: Secondary | ICD-10-CM | POA: Diagnosis not present

## 2019-12-06 DIAGNOSIS — J301 Allergic rhinitis due to pollen: Secondary | ICD-10-CM | POA: Diagnosis not present

## 2019-12-06 DIAGNOSIS — J3089 Other allergic rhinitis: Secondary | ICD-10-CM | POA: Diagnosis not present

## 2019-12-14 DIAGNOSIS — J3089 Other allergic rhinitis: Secondary | ICD-10-CM | POA: Diagnosis not present

## 2019-12-14 DIAGNOSIS — J301 Allergic rhinitis due to pollen: Secondary | ICD-10-CM | POA: Diagnosis not present

## 2019-12-21 DIAGNOSIS — J301 Allergic rhinitis due to pollen: Secondary | ICD-10-CM | POA: Diagnosis not present

## 2019-12-21 DIAGNOSIS — J3089 Other allergic rhinitis: Secondary | ICD-10-CM | POA: Diagnosis not present

## 2019-12-28 DIAGNOSIS — J3089 Other allergic rhinitis: Secondary | ICD-10-CM | POA: Diagnosis not present

## 2019-12-28 DIAGNOSIS — J301 Allergic rhinitis due to pollen: Secondary | ICD-10-CM | POA: Diagnosis not present

## 2020-01-02 DIAGNOSIS — Z6831 Body mass index (BMI) 31.0-31.9, adult: Secondary | ICD-10-CM | POA: Diagnosis not present

## 2020-01-02 DIAGNOSIS — J302 Other seasonal allergic rhinitis: Secondary | ICD-10-CM | POA: Diagnosis not present

## 2020-01-02 DIAGNOSIS — Z0001 Encounter for general adult medical examination with abnormal findings: Secondary | ICD-10-CM | POA: Diagnosis not present

## 2020-01-02 DIAGNOSIS — Z712 Person consulting for explanation of examination or test findings: Secondary | ICD-10-CM | POA: Diagnosis not present

## 2020-01-02 DIAGNOSIS — E1169 Type 2 diabetes mellitus with other specified complication: Secondary | ICD-10-CM | POA: Diagnosis not present

## 2020-01-02 DIAGNOSIS — Z0000 Encounter for general adult medical examination without abnormal findings: Secondary | ICD-10-CM | POA: Diagnosis not present

## 2020-01-02 DIAGNOSIS — J301 Allergic rhinitis due to pollen: Secondary | ICD-10-CM | POA: Diagnosis not present

## 2020-01-02 DIAGNOSIS — Z23 Encounter for immunization: Secondary | ICD-10-CM | POA: Diagnosis not present

## 2020-01-02 DIAGNOSIS — Z713 Dietary counseling and surveillance: Secondary | ICD-10-CM | POA: Diagnosis not present

## 2020-01-02 DIAGNOSIS — Z6830 Body mass index (BMI) 30.0-30.9, adult: Secondary | ICD-10-CM | POA: Diagnosis not present

## 2020-01-02 DIAGNOSIS — H1045 Other chronic allergic conjunctivitis: Secondary | ICD-10-CM | POA: Diagnosis not present

## 2020-01-02 DIAGNOSIS — D509 Iron deficiency anemia, unspecified: Secondary | ICD-10-CM | POA: Diagnosis not present

## 2020-01-04 DIAGNOSIS — J301 Allergic rhinitis due to pollen: Secondary | ICD-10-CM | POA: Diagnosis not present

## 2020-01-04 DIAGNOSIS — J3089 Other allergic rhinitis: Secondary | ICD-10-CM | POA: Diagnosis not present

## 2020-01-07 DIAGNOSIS — D509 Iron deficiency anemia, unspecified: Secondary | ICD-10-CM | POA: Diagnosis not present

## 2020-01-07 DIAGNOSIS — J302 Other seasonal allergic rhinitis: Secondary | ICD-10-CM | POA: Diagnosis not present

## 2020-01-07 DIAGNOSIS — F331 Major depressive disorder, recurrent, moderate: Secondary | ICD-10-CM | POA: Diagnosis not present

## 2020-01-07 DIAGNOSIS — E6609 Other obesity due to excess calories: Secondary | ICD-10-CM | POA: Diagnosis not present

## 2020-01-07 DIAGNOSIS — M8580 Other specified disorders of bone density and structure, unspecified site: Secondary | ICD-10-CM | POA: Diagnosis not present

## 2020-01-07 DIAGNOSIS — E1169 Type 2 diabetes mellitus with other specified complication: Secondary | ICD-10-CM | POA: Diagnosis not present

## 2020-01-07 DIAGNOSIS — J301 Allergic rhinitis due to pollen: Secondary | ICD-10-CM | POA: Diagnosis not present

## 2020-01-07 DIAGNOSIS — E782 Mixed hyperlipidemia: Secondary | ICD-10-CM | POA: Diagnosis not present

## 2020-01-07 DIAGNOSIS — I10 Essential (primary) hypertension: Secondary | ICD-10-CM | POA: Diagnosis not present

## 2020-01-11 DIAGNOSIS — J3089 Other allergic rhinitis: Secondary | ICD-10-CM | POA: Diagnosis not present

## 2020-01-11 DIAGNOSIS — J301 Allergic rhinitis due to pollen: Secondary | ICD-10-CM | POA: Diagnosis not present

## 2020-01-18 DIAGNOSIS — J301 Allergic rhinitis due to pollen: Secondary | ICD-10-CM | POA: Diagnosis not present

## 2020-01-18 DIAGNOSIS — J3089 Other allergic rhinitis: Secondary | ICD-10-CM | POA: Diagnosis not present

## 2020-01-25 DIAGNOSIS — E6609 Other obesity due to excess calories: Secondary | ICD-10-CM | POA: Diagnosis not present

## 2020-01-25 DIAGNOSIS — J3089 Other allergic rhinitis: Secondary | ICD-10-CM | POA: Diagnosis not present

## 2020-01-25 DIAGNOSIS — J302 Other seasonal allergic rhinitis: Secondary | ICD-10-CM | POA: Diagnosis not present

## 2020-01-25 DIAGNOSIS — M8580 Other specified disorders of bone density and structure, unspecified site: Secondary | ICD-10-CM | POA: Diagnosis not present

## 2020-01-25 DIAGNOSIS — F331 Major depressive disorder, recurrent, moderate: Secondary | ICD-10-CM | POA: Diagnosis not present

## 2020-01-25 DIAGNOSIS — I10 Essential (primary) hypertension: Secondary | ICD-10-CM | POA: Diagnosis not present

## 2020-01-25 DIAGNOSIS — E782 Mixed hyperlipidemia: Secondary | ICD-10-CM | POA: Diagnosis not present

## 2020-01-25 DIAGNOSIS — J301 Allergic rhinitis due to pollen: Secondary | ICD-10-CM | POA: Diagnosis not present

## 2020-01-25 DIAGNOSIS — E1169 Type 2 diabetes mellitus with other specified complication: Secondary | ICD-10-CM | POA: Diagnosis not present

## 2020-01-25 DIAGNOSIS — D509 Iron deficiency anemia, unspecified: Secondary | ICD-10-CM | POA: Diagnosis not present

## 2020-02-01 DIAGNOSIS — J301 Allergic rhinitis due to pollen: Secondary | ICD-10-CM | POA: Diagnosis not present

## 2020-02-01 DIAGNOSIS — J3089 Other allergic rhinitis: Secondary | ICD-10-CM | POA: Diagnosis not present

## 2020-02-08 DIAGNOSIS — J301 Allergic rhinitis due to pollen: Secondary | ICD-10-CM | POA: Diagnosis not present

## 2020-02-08 DIAGNOSIS — J3089 Other allergic rhinitis: Secondary | ICD-10-CM | POA: Diagnosis not present

## 2020-02-18 DIAGNOSIS — F331 Major depressive disorder, recurrent, moderate: Secondary | ICD-10-CM | POA: Diagnosis not present

## 2020-02-18 DIAGNOSIS — J302 Other seasonal allergic rhinitis: Secondary | ICD-10-CM | POA: Diagnosis not present

## 2020-02-18 DIAGNOSIS — E1169 Type 2 diabetes mellitus with other specified complication: Secondary | ICD-10-CM | POA: Diagnosis not present

## 2020-02-18 DIAGNOSIS — M8580 Other specified disorders of bone density and structure, unspecified site: Secondary | ICD-10-CM | POA: Diagnosis not present

## 2020-02-18 DIAGNOSIS — E6609 Other obesity due to excess calories: Secondary | ICD-10-CM | POA: Diagnosis not present

## 2020-02-18 DIAGNOSIS — D509 Iron deficiency anemia, unspecified: Secondary | ICD-10-CM | POA: Diagnosis not present

## 2020-02-18 DIAGNOSIS — J301 Allergic rhinitis due to pollen: Secondary | ICD-10-CM | POA: Diagnosis not present

## 2020-02-18 DIAGNOSIS — E782 Mixed hyperlipidemia: Secondary | ICD-10-CM | POA: Diagnosis not present

## 2020-02-18 DIAGNOSIS — J3081 Allergic rhinitis due to animal (cat) (dog) hair and dander: Secondary | ICD-10-CM | POA: Diagnosis not present

## 2020-02-18 DIAGNOSIS — J3089 Other allergic rhinitis: Secondary | ICD-10-CM | POA: Diagnosis not present

## 2020-02-18 DIAGNOSIS — I10 Essential (primary) hypertension: Secondary | ICD-10-CM | POA: Diagnosis not present

## 2020-02-26 DIAGNOSIS — J301 Allergic rhinitis due to pollen: Secondary | ICD-10-CM | POA: Diagnosis not present

## 2020-02-26 DIAGNOSIS — J3089 Other allergic rhinitis: Secondary | ICD-10-CM | POA: Diagnosis not present

## 2020-03-04 DIAGNOSIS — J3089 Other allergic rhinitis: Secondary | ICD-10-CM | POA: Diagnosis not present

## 2020-03-04 DIAGNOSIS — J301 Allergic rhinitis due to pollen: Secondary | ICD-10-CM | POA: Diagnosis not present

## 2020-03-12 DIAGNOSIS — J3089 Other allergic rhinitis: Secondary | ICD-10-CM | POA: Diagnosis not present

## 2020-03-12 DIAGNOSIS — J301 Allergic rhinitis due to pollen: Secondary | ICD-10-CM | POA: Diagnosis not present

## 2020-03-17 DIAGNOSIS — J301 Allergic rhinitis due to pollen: Secondary | ICD-10-CM | POA: Diagnosis not present

## 2020-03-17 DIAGNOSIS — J3089 Other allergic rhinitis: Secondary | ICD-10-CM | POA: Diagnosis not present

## 2020-03-19 DIAGNOSIS — J301 Allergic rhinitis due to pollen: Secondary | ICD-10-CM | POA: Diagnosis not present

## 2020-03-19 DIAGNOSIS — J3089 Other allergic rhinitis: Secondary | ICD-10-CM | POA: Diagnosis not present

## 2020-03-27 DIAGNOSIS — J3089 Other allergic rhinitis: Secondary | ICD-10-CM | POA: Diagnosis not present

## 2020-03-27 DIAGNOSIS — J301 Allergic rhinitis due to pollen: Secondary | ICD-10-CM | POA: Diagnosis not present

## 2020-04-04 DIAGNOSIS — J301 Allergic rhinitis due to pollen: Secondary | ICD-10-CM | POA: Diagnosis not present

## 2020-04-04 DIAGNOSIS — J3089 Other allergic rhinitis: Secondary | ICD-10-CM | POA: Diagnosis not present

## 2020-04-10 DIAGNOSIS — J301 Allergic rhinitis due to pollen: Secondary | ICD-10-CM | POA: Diagnosis not present

## 2020-04-10 DIAGNOSIS — J3081 Allergic rhinitis due to animal (cat) (dog) hair and dander: Secondary | ICD-10-CM | POA: Diagnosis not present

## 2020-04-10 DIAGNOSIS — J3089 Other allergic rhinitis: Secondary | ICD-10-CM | POA: Diagnosis not present

## 2020-04-14 DIAGNOSIS — E1169 Type 2 diabetes mellitus with other specified complication: Secondary | ICD-10-CM | POA: Diagnosis not present

## 2020-04-14 DIAGNOSIS — Z23 Encounter for immunization: Secondary | ICD-10-CM | POA: Diagnosis not present

## 2020-04-14 DIAGNOSIS — Z0001 Encounter for general adult medical examination with abnormal findings: Secondary | ICD-10-CM | POA: Diagnosis not present

## 2020-04-14 DIAGNOSIS — Z713 Dietary counseling and surveillance: Secondary | ICD-10-CM | POA: Diagnosis not present

## 2020-04-14 DIAGNOSIS — Z6830 Body mass index (BMI) 30.0-30.9, adult: Secondary | ICD-10-CM | POA: Diagnosis not present

## 2020-04-14 DIAGNOSIS — D509 Iron deficiency anemia, unspecified: Secondary | ICD-10-CM | POA: Diagnosis not present

## 2020-04-14 DIAGNOSIS — J301 Allergic rhinitis due to pollen: Secondary | ICD-10-CM | POA: Diagnosis not present

## 2020-04-14 DIAGNOSIS — H1045 Other chronic allergic conjunctivitis: Secondary | ICD-10-CM | POA: Diagnosis not present

## 2020-04-14 DIAGNOSIS — J302 Other seasonal allergic rhinitis: Secondary | ICD-10-CM | POA: Diagnosis not present

## 2020-04-14 DIAGNOSIS — Z712 Person consulting for explanation of examination or test findings: Secondary | ICD-10-CM | POA: Diagnosis not present

## 2020-04-14 DIAGNOSIS — Z6831 Body mass index (BMI) 31.0-31.9, adult: Secondary | ICD-10-CM | POA: Diagnosis not present

## 2020-04-14 DIAGNOSIS — Z0000 Encounter for general adult medical examination without abnormal findings: Secondary | ICD-10-CM | POA: Diagnosis not present

## 2020-04-17 DIAGNOSIS — M8580 Other specified disorders of bone density and structure, unspecified site: Secondary | ICD-10-CM | POA: Diagnosis not present

## 2020-04-17 DIAGNOSIS — J302 Other seasonal allergic rhinitis: Secondary | ICD-10-CM | POA: Diagnosis not present

## 2020-04-17 DIAGNOSIS — D509 Iron deficiency anemia, unspecified: Secondary | ICD-10-CM | POA: Diagnosis not present

## 2020-04-17 DIAGNOSIS — R059 Cough, unspecified: Secondary | ICD-10-CM | POA: Diagnosis not present

## 2020-04-17 DIAGNOSIS — J301 Allergic rhinitis due to pollen: Secondary | ICD-10-CM | POA: Diagnosis not present

## 2020-04-17 DIAGNOSIS — J3089 Other allergic rhinitis: Secondary | ICD-10-CM | POA: Diagnosis not present

## 2020-04-17 DIAGNOSIS — F331 Major depressive disorder, recurrent, moderate: Secondary | ICD-10-CM | POA: Diagnosis not present

## 2020-04-17 DIAGNOSIS — E6609 Other obesity due to excess calories: Secondary | ICD-10-CM | POA: Diagnosis not present

## 2020-04-17 DIAGNOSIS — E782 Mixed hyperlipidemia: Secondary | ICD-10-CM | POA: Diagnosis not present

## 2020-04-17 DIAGNOSIS — R0981 Nasal congestion: Secondary | ICD-10-CM | POA: Diagnosis not present

## 2020-04-17 DIAGNOSIS — E1169 Type 2 diabetes mellitus with other specified complication: Secondary | ICD-10-CM | POA: Diagnosis not present

## 2020-04-17 DIAGNOSIS — I10 Essential (primary) hypertension: Secondary | ICD-10-CM | POA: Diagnosis not present

## 2020-04-18 DIAGNOSIS — E6609 Other obesity due to excess calories: Secondary | ICD-10-CM | POA: Diagnosis not present

## 2020-04-18 DIAGNOSIS — I10 Essential (primary) hypertension: Secondary | ICD-10-CM | POA: Diagnosis not present

## 2020-04-18 DIAGNOSIS — D509 Iron deficiency anemia, unspecified: Secondary | ICD-10-CM | POA: Diagnosis not present

## 2020-04-18 DIAGNOSIS — M8580 Other specified disorders of bone density and structure, unspecified site: Secondary | ICD-10-CM | POA: Diagnosis not present

## 2020-04-18 DIAGNOSIS — E1169 Type 2 diabetes mellitus with other specified complication: Secondary | ICD-10-CM | POA: Diagnosis not present

## 2020-04-18 DIAGNOSIS — E782 Mixed hyperlipidemia: Secondary | ICD-10-CM | POA: Diagnosis not present

## 2020-04-18 DIAGNOSIS — F331 Major depressive disorder, recurrent, moderate: Secondary | ICD-10-CM | POA: Diagnosis not present

## 2020-04-18 DIAGNOSIS — J301 Allergic rhinitis due to pollen: Secondary | ICD-10-CM | POA: Diagnosis not present

## 2020-04-18 DIAGNOSIS — J302 Other seasonal allergic rhinitis: Secondary | ICD-10-CM | POA: Diagnosis not present

## 2020-04-24 DIAGNOSIS — J3089 Other allergic rhinitis: Secondary | ICD-10-CM | POA: Diagnosis not present

## 2020-04-24 DIAGNOSIS — J301 Allergic rhinitis due to pollen: Secondary | ICD-10-CM | POA: Diagnosis not present

## 2020-05-01 DIAGNOSIS — J301 Allergic rhinitis due to pollen: Secondary | ICD-10-CM | POA: Diagnosis not present

## 2020-05-01 DIAGNOSIS — J3089 Other allergic rhinitis: Secondary | ICD-10-CM | POA: Diagnosis not present

## 2020-05-02 ENCOUNTER — Encounter: Payer: PPO | Admitting: Obstetrics & Gynecology

## 2020-05-08 DIAGNOSIS — J301 Allergic rhinitis due to pollen: Secondary | ICD-10-CM | POA: Diagnosis not present

## 2020-05-08 DIAGNOSIS — J3089 Other allergic rhinitis: Secondary | ICD-10-CM | POA: Diagnosis not present

## 2020-05-15 DIAGNOSIS — J3089 Other allergic rhinitis: Secondary | ICD-10-CM | POA: Diagnosis not present

## 2020-05-15 DIAGNOSIS — J301 Allergic rhinitis due to pollen: Secondary | ICD-10-CM | POA: Diagnosis not present

## 2020-05-15 DIAGNOSIS — J3081 Allergic rhinitis due to animal (cat) (dog) hair and dander: Secondary | ICD-10-CM | POA: Diagnosis not present

## 2020-05-17 DIAGNOSIS — E1169 Type 2 diabetes mellitus with other specified complication: Secondary | ICD-10-CM | POA: Diagnosis not present

## 2020-05-17 DIAGNOSIS — E782 Mixed hyperlipidemia: Secondary | ICD-10-CM | POA: Diagnosis not present

## 2020-05-17 DIAGNOSIS — I10 Essential (primary) hypertension: Secondary | ICD-10-CM | POA: Diagnosis not present

## 2020-05-17 DIAGNOSIS — D509 Iron deficiency anemia, unspecified: Secondary | ICD-10-CM | POA: Diagnosis not present

## 2020-05-17 DIAGNOSIS — J302 Other seasonal allergic rhinitis: Secondary | ICD-10-CM | POA: Diagnosis not present

## 2020-05-17 DIAGNOSIS — R0981 Nasal congestion: Secondary | ICD-10-CM | POA: Diagnosis not present

## 2020-05-17 DIAGNOSIS — F331 Major depressive disorder, recurrent, moderate: Secondary | ICD-10-CM | POA: Diagnosis not present

## 2020-05-17 DIAGNOSIS — J301 Allergic rhinitis due to pollen: Secondary | ICD-10-CM | POA: Diagnosis not present

## 2020-05-17 DIAGNOSIS — R059 Cough, unspecified: Secondary | ICD-10-CM | POA: Diagnosis not present

## 2020-05-17 DIAGNOSIS — E6609 Other obesity due to excess calories: Secondary | ICD-10-CM | POA: Diagnosis not present

## 2020-05-17 DIAGNOSIS — M8580 Other specified disorders of bone density and structure, unspecified site: Secondary | ICD-10-CM | POA: Diagnosis not present

## 2020-05-22 DIAGNOSIS — J301 Allergic rhinitis due to pollen: Secondary | ICD-10-CM | POA: Diagnosis not present

## 2020-05-22 DIAGNOSIS — J3089 Other allergic rhinitis: Secondary | ICD-10-CM | POA: Diagnosis not present

## 2020-05-22 DIAGNOSIS — J3081 Allergic rhinitis due to animal (cat) (dog) hair and dander: Secondary | ICD-10-CM | POA: Diagnosis not present

## 2020-05-29 DIAGNOSIS — J3081 Allergic rhinitis due to animal (cat) (dog) hair and dander: Secondary | ICD-10-CM | POA: Diagnosis not present

## 2020-05-29 DIAGNOSIS — J301 Allergic rhinitis due to pollen: Secondary | ICD-10-CM | POA: Diagnosis not present

## 2020-05-29 DIAGNOSIS — J3089 Other allergic rhinitis: Secondary | ICD-10-CM | POA: Diagnosis not present

## 2020-06-05 DIAGNOSIS — J3081 Allergic rhinitis due to animal (cat) (dog) hair and dander: Secondary | ICD-10-CM | POA: Diagnosis not present

## 2020-06-05 DIAGNOSIS — J301 Allergic rhinitis due to pollen: Secondary | ICD-10-CM | POA: Diagnosis not present

## 2020-06-05 DIAGNOSIS — J3089 Other allergic rhinitis: Secondary | ICD-10-CM | POA: Diagnosis not present

## 2020-06-12 DIAGNOSIS — J3089 Other allergic rhinitis: Secondary | ICD-10-CM | POA: Diagnosis not present

## 2020-06-12 DIAGNOSIS — J301 Allergic rhinitis due to pollen: Secondary | ICD-10-CM | POA: Diagnosis not present

## 2020-06-16 DIAGNOSIS — R0981 Nasal congestion: Secondary | ICD-10-CM | POA: Diagnosis not present

## 2020-06-16 DIAGNOSIS — J302 Other seasonal allergic rhinitis: Secondary | ICD-10-CM | POA: Diagnosis not present

## 2020-06-16 DIAGNOSIS — E782 Mixed hyperlipidemia: Secondary | ICD-10-CM | POA: Diagnosis not present

## 2020-06-16 DIAGNOSIS — E1169 Type 2 diabetes mellitus with other specified complication: Secondary | ICD-10-CM | POA: Diagnosis not present

## 2020-06-16 DIAGNOSIS — M8580 Other specified disorders of bone density and structure, unspecified site: Secondary | ICD-10-CM | POA: Diagnosis not present

## 2020-06-16 DIAGNOSIS — J301 Allergic rhinitis due to pollen: Secondary | ICD-10-CM | POA: Diagnosis not present

## 2020-06-16 DIAGNOSIS — I10 Essential (primary) hypertension: Secondary | ICD-10-CM | POA: Diagnosis not present

## 2020-06-16 DIAGNOSIS — E6609 Other obesity due to excess calories: Secondary | ICD-10-CM | POA: Diagnosis not present

## 2020-06-16 DIAGNOSIS — F331 Major depressive disorder, recurrent, moderate: Secondary | ICD-10-CM | POA: Diagnosis not present

## 2020-06-16 DIAGNOSIS — D509 Iron deficiency anemia, unspecified: Secondary | ICD-10-CM | POA: Diagnosis not present

## 2020-06-16 DIAGNOSIS — R059 Cough, unspecified: Secondary | ICD-10-CM | POA: Diagnosis not present

## 2020-06-18 ENCOUNTER — Other Ambulatory Visit: Payer: Self-pay

## 2020-06-18 ENCOUNTER — Ambulatory Visit: Payer: PPO | Admitting: Obstetrics & Gynecology

## 2020-06-18 ENCOUNTER — Encounter: Payer: Self-pay | Admitting: Obstetrics & Gynecology

## 2020-06-18 VITALS — BP 118/70 | Ht 63.0 in | Wt 179.0 lb

## 2020-06-18 DIAGNOSIS — E661 Drug-induced obesity: Secondary | ICD-10-CM

## 2020-06-18 DIAGNOSIS — Z01419 Encounter for gynecological examination (general) (routine) without abnormal findings: Secondary | ICD-10-CM

## 2020-06-18 DIAGNOSIS — Z780 Asymptomatic menopausal state: Secondary | ICD-10-CM

## 2020-06-18 DIAGNOSIS — M85851 Other specified disorders of bone density and structure, right thigh: Secondary | ICD-10-CM

## 2020-06-18 DIAGNOSIS — Z6831 Body mass index (BMI) 31.0-31.9, adult: Secondary | ICD-10-CM

## 2020-06-18 NOTE — Progress Notes (Signed)
Brenda Hatfield April 12, 1946 [id number]   History:    75 y.o. G3P3L3 Married. Has grand-children.  VE:[id number] presenting for annual gyn exam   EUM:[id number], well on no HRT. No PMB. No change, still has an occasional tingling sensation in the Rt lower abdomen, previous investigation negative. Abstinent x 3 years. Urine and bowel movements normal. Breasts normal. Body mass index 31.71. Health labs with family physician.  Cologard last year.   Past medical history,surgical history, family history and social history were all reviewed and documented in the EPIC chart.  Gynecologic History No LMP recorded. Patient is postmenopausal.  Obstetric History OB History  Gravida Para Term Preterm AB Living  3         3  SAB IAB Ectopic Multiple Live Births               # Outcome Date GA Lbr Len/2nd Weight Sex Delivery Anes PTL Lv  3 Gravida           2 Gravida           1 Gravida              ROS: A ROS was performed and pertinent positives and negatives are included in the history.  GENERAL: No fevers or chills. HEENT: No change in vision, no earache, sore throat or sinus congestion. NECK: No pain or stiffness. CARDIOVASCULAR: No chest pain or pressure. No palpitations. PULMONARY: No shortness of breath, cough or wheeze. GASTROINTESTINAL: No abdominal pain, nausea, vomiting or diarrhea, melena or bright red blood per rectum. GENITOURINARY: No urinary frequency, urgency, hesitancy or dysuria. MUSCULOSKELETAL: No joint or muscle pain, no back pain, no recent trauma. DERMATOLOGIC: No rash, no itching, no lesions. ENDOCRINE: No polyuria, polydipsia, no heat or cold intolerance. No recent change in weight. HEMATOLOGICAL: No anemia or easy bruising or bleeding. NEUROLOGIC: No headache, seizures, numbness, tingling or weakness. PSYCHIATRIC: No depression, no loss of interest in normal activity or change in sleep pattern.     Exam:   BP 118/70   Ht 5\' 3"  (1.6 m)   Wt  179 lb (81.2 kg)   BMI 31.71 kg/m   Body mass index is 31.71 kg/m.  General appearance : Well developed well nourished female. No acute distress HEENT: Eyes: no retinal hemorrhage or exudates,  Neck supple, trachea midline, no carotid bruits, no thyroidmegaly Lungs: Clear to auscultation, no rhonchi or wheezes, or rib retractions  Heart: Regular rate and rhythm, no murmurs or gallops Breast:Examined in sitting and supine position were symmetrical in appearance, no palpable masses or tenderness,  no skin retraction, no nipple inversion, no nipple discharge, no skin discoloration, no axillary or supraclavicular lymphadenopathy Abdomen: no palpable masses or tenderness, no rebound or guarding Extremities: no edema or skin discoloration or tenderness  Pelvic: Vulva: Normal             Vagina: No gross lesions or discharge  Cervix: No gross lesions or discharge  Uterus  AV, normal size, shape and consistency, non-tender and mobile  Adnexa  Without masses or tenderness  Anus: Normal   Assessment/Plan:  75 y.o. female for annual exam   1. Well female exam with routine gynecological exam Normal gynecologic exam in menopause.  No indication for Pap test at this time.  Breast exam normal.  Had difficulty scheduling screening mammogram at the breast center, will try again.  Cologuard last year.  Health labs with family physician.  2. Postmenopause Well on no hormone replacement therapy.  No postmenopausal bleeding.  3. Osteopenia of neck of right femur History of osteopenia, will schedule a bone density here now.  Vitamin D supplements, calcium intake of 1.5 g/day total and regular weightbearing physical activity is recommended. - DG Bone Density; Future  4. Class 1 drug-induced obesity with serious comorbidity and body mass index (BMI) of 31.0 to 31.9 in adult Recommend a lower calorie/carb diet.  Increase fitness activities.  Other orders - losartan (COZAAR) 25 MG tablet; Take 25 mg by  mouth daily.  Princess Bruins MD, 11:58 AM 06/18/2020

## 2020-06-19 DIAGNOSIS — J3089 Other allergic rhinitis: Secondary | ICD-10-CM | POA: Diagnosis not present

## 2020-06-19 DIAGNOSIS — J301 Allergic rhinitis due to pollen: Secondary | ICD-10-CM | POA: Diagnosis not present

## 2020-06-27 DIAGNOSIS — J301 Allergic rhinitis due to pollen: Secondary | ICD-10-CM | POA: Diagnosis not present

## 2020-06-27 DIAGNOSIS — J3089 Other allergic rhinitis: Secondary | ICD-10-CM | POA: Diagnosis not present

## 2020-07-04 DIAGNOSIS — J301 Allergic rhinitis due to pollen: Secondary | ICD-10-CM | POA: Diagnosis not present

## 2020-07-04 DIAGNOSIS — J3089 Other allergic rhinitis: Secondary | ICD-10-CM | POA: Diagnosis not present

## 2020-07-07 DIAGNOSIS — J3089 Other allergic rhinitis: Secondary | ICD-10-CM | POA: Diagnosis not present

## 2020-07-07 DIAGNOSIS — H1045 Other chronic allergic conjunctivitis: Secondary | ICD-10-CM | POA: Diagnosis not present

## 2020-07-07 DIAGNOSIS — J301 Allergic rhinitis due to pollen: Secondary | ICD-10-CM | POA: Diagnosis not present

## 2020-07-11 DIAGNOSIS — J301 Allergic rhinitis due to pollen: Secondary | ICD-10-CM | POA: Diagnosis not present

## 2020-07-11 DIAGNOSIS — J3089 Other allergic rhinitis: Secondary | ICD-10-CM | POA: Diagnosis not present

## 2020-07-11 DIAGNOSIS — J3081 Allergic rhinitis due to animal (cat) (dog) hair and dander: Secondary | ICD-10-CM | POA: Diagnosis not present

## 2020-07-16 DIAGNOSIS — R059 Cough, unspecified: Secondary | ICD-10-CM | POA: Diagnosis not present

## 2020-07-16 DIAGNOSIS — E1169 Type 2 diabetes mellitus with other specified complication: Secondary | ICD-10-CM | POA: Diagnosis not present

## 2020-07-16 DIAGNOSIS — F331 Major depressive disorder, recurrent, moderate: Secondary | ICD-10-CM | POA: Diagnosis not present

## 2020-07-16 DIAGNOSIS — D509 Iron deficiency anemia, unspecified: Secondary | ICD-10-CM | POA: Diagnosis not present

## 2020-07-16 DIAGNOSIS — R0981 Nasal congestion: Secondary | ICD-10-CM | POA: Diagnosis not present

## 2020-07-16 DIAGNOSIS — E782 Mixed hyperlipidemia: Secondary | ICD-10-CM | POA: Diagnosis not present

## 2020-07-16 DIAGNOSIS — M8580 Other specified disorders of bone density and structure, unspecified site: Secondary | ICD-10-CM | POA: Diagnosis not present

## 2020-07-16 DIAGNOSIS — J301 Allergic rhinitis due to pollen: Secondary | ICD-10-CM | POA: Diagnosis not present

## 2020-07-16 DIAGNOSIS — I10 Essential (primary) hypertension: Secondary | ICD-10-CM | POA: Diagnosis not present

## 2020-07-16 DIAGNOSIS — E6609 Other obesity due to excess calories: Secondary | ICD-10-CM | POA: Diagnosis not present

## 2020-07-16 DIAGNOSIS — J302 Other seasonal allergic rhinitis: Secondary | ICD-10-CM | POA: Diagnosis not present

## 2020-07-18 DIAGNOSIS — J3081 Allergic rhinitis due to animal (cat) (dog) hair and dander: Secondary | ICD-10-CM | POA: Diagnosis not present

## 2020-07-18 DIAGNOSIS — J301 Allergic rhinitis due to pollen: Secondary | ICD-10-CM | POA: Diagnosis not present

## 2020-07-18 DIAGNOSIS — J3089 Other allergic rhinitis: Secondary | ICD-10-CM | POA: Diagnosis not present

## 2020-07-21 DIAGNOSIS — M8580 Other specified disorders of bone density and structure, unspecified site: Secondary | ICD-10-CM | POA: Diagnosis not present

## 2020-07-21 DIAGNOSIS — E782 Mixed hyperlipidemia: Secondary | ICD-10-CM | POA: Diagnosis not present

## 2020-07-21 DIAGNOSIS — I10 Essential (primary) hypertension: Secondary | ICD-10-CM | POA: Diagnosis not present

## 2020-07-21 DIAGNOSIS — E1169 Type 2 diabetes mellitus with other specified complication: Secondary | ICD-10-CM | POA: Diagnosis not present

## 2020-07-21 DIAGNOSIS — D509 Iron deficiency anemia, unspecified: Secondary | ICD-10-CM | POA: Diagnosis not present

## 2020-07-21 DIAGNOSIS — Z6831 Body mass index (BMI) 31.0-31.9, adult: Secondary | ICD-10-CM | POA: Diagnosis not present

## 2020-07-21 DIAGNOSIS — E119 Type 2 diabetes mellitus without complications: Secondary | ICD-10-CM | POA: Diagnosis not present

## 2020-07-21 DIAGNOSIS — E6609 Other obesity due to excess calories: Secondary | ICD-10-CM | POA: Diagnosis not present

## 2020-07-21 DIAGNOSIS — E1165 Type 2 diabetes mellitus with hyperglycemia: Secondary | ICD-10-CM | POA: Diagnosis not present

## 2020-07-24 DIAGNOSIS — E569 Vitamin deficiency, unspecified: Secondary | ICD-10-CM | POA: Diagnosis not present

## 2020-07-24 DIAGNOSIS — E1169 Type 2 diabetes mellitus with other specified complication: Secondary | ICD-10-CM | POA: Diagnosis not present

## 2020-07-24 DIAGNOSIS — J301 Allergic rhinitis due to pollen: Secondary | ICD-10-CM | POA: Diagnosis not present

## 2020-07-24 DIAGNOSIS — E782 Mixed hyperlipidemia: Secondary | ICD-10-CM | POA: Diagnosis not present

## 2020-07-24 DIAGNOSIS — D509 Iron deficiency anemia, unspecified: Secondary | ICD-10-CM | POA: Diagnosis not present

## 2020-07-24 DIAGNOSIS — E6609 Other obesity due to excess calories: Secondary | ICD-10-CM | POA: Diagnosis not present

## 2020-07-24 DIAGNOSIS — F331 Major depressive disorder, recurrent, moderate: Secondary | ICD-10-CM | POA: Diagnosis not present

## 2020-07-24 DIAGNOSIS — J302 Other seasonal allergic rhinitis: Secondary | ICD-10-CM | POA: Diagnosis not present

## 2020-07-24 DIAGNOSIS — M8580 Other specified disorders of bone density and structure, unspecified site: Secondary | ICD-10-CM | POA: Diagnosis not present

## 2020-07-24 DIAGNOSIS — I10 Essential (primary) hypertension: Secondary | ICD-10-CM | POA: Diagnosis not present

## 2020-07-25 DIAGNOSIS — J3081 Allergic rhinitis due to animal (cat) (dog) hair and dander: Secondary | ICD-10-CM | POA: Diagnosis not present

## 2020-07-25 DIAGNOSIS — J3089 Other allergic rhinitis: Secondary | ICD-10-CM | POA: Diagnosis not present

## 2020-07-25 DIAGNOSIS — J301 Allergic rhinitis due to pollen: Secondary | ICD-10-CM | POA: Diagnosis not present

## 2020-08-08 DIAGNOSIS — J301 Allergic rhinitis due to pollen: Secondary | ICD-10-CM | POA: Diagnosis not present

## 2020-08-08 DIAGNOSIS — J3089 Other allergic rhinitis: Secondary | ICD-10-CM | POA: Diagnosis not present

## 2020-08-08 DIAGNOSIS — J3081 Allergic rhinitis due to animal (cat) (dog) hair and dander: Secondary | ICD-10-CM | POA: Diagnosis not present

## 2020-08-15 DIAGNOSIS — J301 Allergic rhinitis due to pollen: Secondary | ICD-10-CM | POA: Diagnosis not present

## 2020-08-15 DIAGNOSIS — J3089 Other allergic rhinitis: Secondary | ICD-10-CM | POA: Diagnosis not present

## 2020-08-17 DIAGNOSIS — E1165 Type 2 diabetes mellitus with hyperglycemia: Secondary | ICD-10-CM | POA: Diagnosis not present

## 2020-08-17 DIAGNOSIS — I10 Essential (primary) hypertension: Secondary | ICD-10-CM | POA: Diagnosis not present

## 2020-08-22 DIAGNOSIS — J301 Allergic rhinitis due to pollen: Secondary | ICD-10-CM | POA: Diagnosis not present

## 2020-08-29 DIAGNOSIS — J3089 Other allergic rhinitis: Secondary | ICD-10-CM | POA: Diagnosis not present

## 2020-08-29 DIAGNOSIS — J301 Allergic rhinitis due to pollen: Secondary | ICD-10-CM | POA: Diagnosis not present

## 2020-09-05 DIAGNOSIS — J3089 Other allergic rhinitis: Secondary | ICD-10-CM | POA: Diagnosis not present

## 2020-09-05 DIAGNOSIS — J301 Allergic rhinitis due to pollen: Secondary | ICD-10-CM | POA: Diagnosis not present

## 2020-09-09 DIAGNOSIS — J301 Allergic rhinitis due to pollen: Secondary | ICD-10-CM | POA: Diagnosis not present

## 2020-09-09 DIAGNOSIS — J3089 Other allergic rhinitis: Secondary | ICD-10-CM | POA: Diagnosis not present

## 2020-09-16 DIAGNOSIS — J3089 Other allergic rhinitis: Secondary | ICD-10-CM | POA: Diagnosis not present

## 2020-09-16 DIAGNOSIS — J301 Allergic rhinitis due to pollen: Secondary | ICD-10-CM | POA: Diagnosis not present

## 2020-09-24 DIAGNOSIS — J3089 Other allergic rhinitis: Secondary | ICD-10-CM | POA: Diagnosis not present

## 2020-09-24 DIAGNOSIS — J301 Allergic rhinitis due to pollen: Secondary | ICD-10-CM | POA: Diagnosis not present

## 2020-10-02 DIAGNOSIS — J301 Allergic rhinitis due to pollen: Secondary | ICD-10-CM | POA: Diagnosis not present

## 2020-10-02 DIAGNOSIS — J3089 Other allergic rhinitis: Secondary | ICD-10-CM | POA: Diagnosis not present

## 2020-10-08 DIAGNOSIS — E1165 Type 2 diabetes mellitus with hyperglycemia: Secondary | ICD-10-CM | POA: Diagnosis not present

## 2020-10-08 DIAGNOSIS — I10 Essential (primary) hypertension: Secondary | ICD-10-CM | POA: Diagnosis not present

## 2020-10-10 DIAGNOSIS — J301 Allergic rhinitis due to pollen: Secondary | ICD-10-CM | POA: Diagnosis not present

## 2020-10-10 DIAGNOSIS — J3089 Other allergic rhinitis: Secondary | ICD-10-CM | POA: Diagnosis not present

## 2020-10-17 DIAGNOSIS — J3089 Other allergic rhinitis: Secondary | ICD-10-CM | POA: Diagnosis not present

## 2020-10-17 DIAGNOSIS — J301 Allergic rhinitis due to pollen: Secondary | ICD-10-CM | POA: Diagnosis not present

## 2020-10-24 DIAGNOSIS — E782 Mixed hyperlipidemia: Secondary | ICD-10-CM | POA: Diagnosis not present

## 2020-10-24 DIAGNOSIS — J301 Allergic rhinitis due to pollen: Secondary | ICD-10-CM | POA: Diagnosis not present

## 2020-10-24 DIAGNOSIS — J3089 Other allergic rhinitis: Secondary | ICD-10-CM | POA: Diagnosis not present

## 2020-10-24 DIAGNOSIS — E119 Type 2 diabetes mellitus without complications: Secondary | ICD-10-CM | POA: Diagnosis not present

## 2020-10-31 DIAGNOSIS — J3089 Other allergic rhinitis: Secondary | ICD-10-CM | POA: Diagnosis not present

## 2020-10-31 DIAGNOSIS — J301 Allergic rhinitis due to pollen: Secondary | ICD-10-CM | POA: Diagnosis not present

## 2020-11-07 DIAGNOSIS — J301 Allergic rhinitis due to pollen: Secondary | ICD-10-CM | POA: Diagnosis not present

## 2020-11-07 DIAGNOSIS — J3089 Other allergic rhinitis: Secondary | ICD-10-CM | POA: Diagnosis not present

## 2020-11-14 DIAGNOSIS — J301 Allergic rhinitis due to pollen: Secondary | ICD-10-CM | POA: Diagnosis not present

## 2020-11-14 DIAGNOSIS — J3089 Other allergic rhinitis: Secondary | ICD-10-CM | POA: Diagnosis not present

## 2020-11-17 DIAGNOSIS — F329 Major depressive disorder, single episode, unspecified: Secondary | ICD-10-CM | POA: Diagnosis not present

## 2020-11-17 DIAGNOSIS — E1169 Type 2 diabetes mellitus with other specified complication: Secondary | ICD-10-CM | POA: Diagnosis not present

## 2020-11-17 DIAGNOSIS — D509 Iron deficiency anemia, unspecified: Secondary | ICD-10-CM | POA: Diagnosis not present

## 2020-11-17 DIAGNOSIS — E782 Mixed hyperlipidemia: Secondary | ICD-10-CM | POA: Diagnosis not present

## 2020-11-17 DIAGNOSIS — Z0001 Encounter for general adult medical examination with abnormal findings: Secondary | ICD-10-CM | POA: Diagnosis not present

## 2020-11-17 DIAGNOSIS — I10 Essential (primary) hypertension: Secondary | ICD-10-CM | POA: Diagnosis not present

## 2020-11-17 DIAGNOSIS — E669 Obesity, unspecified: Secondary | ICD-10-CM | POA: Diagnosis not present

## 2020-11-17 DIAGNOSIS — E1140 Type 2 diabetes mellitus with diabetic neuropathy, unspecified: Secondary | ICD-10-CM | POA: Diagnosis not present

## 2020-11-17 DIAGNOSIS — M8580 Other specified disorders of bone density and structure, unspecified site: Secondary | ICD-10-CM | POA: Diagnosis not present

## 2020-11-17 DIAGNOSIS — J309 Allergic rhinitis, unspecified: Secondary | ICD-10-CM | POA: Diagnosis not present

## 2020-11-21 DIAGNOSIS — J301 Allergic rhinitis due to pollen: Secondary | ICD-10-CM | POA: Diagnosis not present

## 2020-11-21 DIAGNOSIS — J3089 Other allergic rhinitis: Secondary | ICD-10-CM | POA: Diagnosis not present

## 2020-11-28 DIAGNOSIS — J3089 Other allergic rhinitis: Secondary | ICD-10-CM | POA: Diagnosis not present

## 2020-11-28 DIAGNOSIS — J301 Allergic rhinitis due to pollen: Secondary | ICD-10-CM | POA: Diagnosis not present

## 2020-12-05 DIAGNOSIS — J3089 Other allergic rhinitis: Secondary | ICD-10-CM | POA: Diagnosis not present

## 2020-12-05 DIAGNOSIS — J301 Allergic rhinitis due to pollen: Secondary | ICD-10-CM | POA: Diagnosis not present

## 2020-12-12 DIAGNOSIS — J3089 Other allergic rhinitis: Secondary | ICD-10-CM | POA: Diagnosis not present

## 2020-12-12 DIAGNOSIS — J301 Allergic rhinitis due to pollen: Secondary | ICD-10-CM | POA: Diagnosis not present

## 2020-12-17 DIAGNOSIS — I10 Essential (primary) hypertension: Secondary | ICD-10-CM | POA: Diagnosis not present

## 2020-12-17 DIAGNOSIS — E1165 Type 2 diabetes mellitus with hyperglycemia: Secondary | ICD-10-CM | POA: Diagnosis not present

## 2020-12-25 DIAGNOSIS — J3089 Other allergic rhinitis: Secondary | ICD-10-CM | POA: Diagnosis not present

## 2020-12-25 DIAGNOSIS — J301 Allergic rhinitis due to pollen: Secondary | ICD-10-CM | POA: Diagnosis not present

## 2021-01-01 DIAGNOSIS — J3089 Other allergic rhinitis: Secondary | ICD-10-CM | POA: Diagnosis not present

## 2021-01-01 DIAGNOSIS — J301 Allergic rhinitis due to pollen: Secondary | ICD-10-CM | POA: Diagnosis not present

## 2021-01-12 DIAGNOSIS — J3089 Other allergic rhinitis: Secondary | ICD-10-CM | POA: Diagnosis not present

## 2021-01-12 DIAGNOSIS — J301 Allergic rhinitis due to pollen: Secondary | ICD-10-CM | POA: Diagnosis not present

## 2021-01-21 DIAGNOSIS — J301 Allergic rhinitis due to pollen: Secondary | ICD-10-CM | POA: Diagnosis not present

## 2021-01-21 DIAGNOSIS — J3089 Other allergic rhinitis: Secondary | ICD-10-CM | POA: Diagnosis not present

## 2021-02-02 DIAGNOSIS — E119 Type 2 diabetes mellitus without complications: Secondary | ICD-10-CM | POA: Diagnosis not present

## 2021-02-02 DIAGNOSIS — J3089 Other allergic rhinitis: Secondary | ICD-10-CM | POA: Diagnosis not present

## 2021-02-02 DIAGNOSIS — J301 Allergic rhinitis due to pollen: Secondary | ICD-10-CM | POA: Diagnosis not present

## 2021-02-12 DIAGNOSIS — J3089 Other allergic rhinitis: Secondary | ICD-10-CM | POA: Diagnosis not present

## 2021-02-12 DIAGNOSIS — J301 Allergic rhinitis due to pollen: Secondary | ICD-10-CM | POA: Diagnosis not present

## 2021-02-20 DIAGNOSIS — J301 Allergic rhinitis due to pollen: Secondary | ICD-10-CM | POA: Diagnosis not present

## 2021-02-20 DIAGNOSIS — J3089 Other allergic rhinitis: Secondary | ICD-10-CM | POA: Diagnosis not present

## 2021-02-20 DIAGNOSIS — E1169 Type 2 diabetes mellitus with other specified complication: Secondary | ICD-10-CM | POA: Diagnosis not present

## 2021-02-20 DIAGNOSIS — E782 Mixed hyperlipidemia: Secondary | ICD-10-CM | POA: Diagnosis not present

## 2021-03-02 DIAGNOSIS — J3089 Other allergic rhinitis: Secondary | ICD-10-CM | POA: Diagnosis not present

## 2021-03-02 DIAGNOSIS — J301 Allergic rhinitis due to pollen: Secondary | ICD-10-CM | POA: Diagnosis not present

## 2021-03-10 DIAGNOSIS — J301 Allergic rhinitis due to pollen: Secondary | ICD-10-CM | POA: Diagnosis not present

## 2021-03-10 DIAGNOSIS — J3089 Other allergic rhinitis: Secondary | ICD-10-CM | POA: Diagnosis not present

## 2021-03-17 DIAGNOSIS — J301 Allergic rhinitis due to pollen: Secondary | ICD-10-CM | POA: Diagnosis not present

## 2021-03-17 DIAGNOSIS — J3089 Other allergic rhinitis: Secondary | ICD-10-CM | POA: Diagnosis not present

## 2021-03-24 DIAGNOSIS — J3089 Other allergic rhinitis: Secondary | ICD-10-CM | POA: Diagnosis not present

## 2021-03-24 DIAGNOSIS — J301 Allergic rhinitis due to pollen: Secondary | ICD-10-CM | POA: Diagnosis not present

## 2021-03-31 DIAGNOSIS — J3089 Other allergic rhinitis: Secondary | ICD-10-CM | POA: Diagnosis not present

## 2021-03-31 DIAGNOSIS — J301 Allergic rhinitis due to pollen: Secondary | ICD-10-CM | POA: Diagnosis not present

## 2021-04-01 DIAGNOSIS — J3089 Other allergic rhinitis: Secondary | ICD-10-CM | POA: Diagnosis not present

## 2021-04-01 DIAGNOSIS — J301 Allergic rhinitis due to pollen: Secondary | ICD-10-CM | POA: Diagnosis not present

## 2021-04-07 DIAGNOSIS — J301 Allergic rhinitis due to pollen: Secondary | ICD-10-CM | POA: Diagnosis not present

## 2021-04-07 DIAGNOSIS — J3089 Other allergic rhinitis: Secondary | ICD-10-CM | POA: Diagnosis not present

## 2021-04-14 DIAGNOSIS — J3089 Other allergic rhinitis: Secondary | ICD-10-CM | POA: Diagnosis not present

## 2021-04-14 DIAGNOSIS — J301 Allergic rhinitis due to pollen: Secondary | ICD-10-CM | POA: Diagnosis not present

## 2021-04-21 DIAGNOSIS — J301 Allergic rhinitis due to pollen: Secondary | ICD-10-CM | POA: Diagnosis not present

## 2021-04-21 DIAGNOSIS — J3089 Other allergic rhinitis: Secondary | ICD-10-CM | POA: Diagnosis not present

## 2021-04-28 DIAGNOSIS — J3089 Other allergic rhinitis: Secondary | ICD-10-CM | POA: Diagnosis not present

## 2021-04-28 DIAGNOSIS — J301 Allergic rhinitis due to pollen: Secondary | ICD-10-CM | POA: Diagnosis not present

## 2021-05-06 DIAGNOSIS — J3089 Other allergic rhinitis: Secondary | ICD-10-CM | POA: Diagnosis not present

## 2021-05-06 DIAGNOSIS — J301 Allergic rhinitis due to pollen: Secondary | ICD-10-CM | POA: Diagnosis not present

## 2021-05-14 DIAGNOSIS — J301 Allergic rhinitis due to pollen: Secondary | ICD-10-CM | POA: Diagnosis not present

## 2021-05-14 DIAGNOSIS — J3089 Other allergic rhinitis: Secondary | ICD-10-CM | POA: Diagnosis not present

## 2021-05-21 ENCOUNTER — Ambulatory Visit: Payer: PPO | Admitting: Cardiovascular Disease

## 2021-05-21 ENCOUNTER — Other Ambulatory Visit: Payer: Self-pay

## 2021-05-21 ENCOUNTER — Encounter: Payer: Self-pay | Admitting: Cardiovascular Disease

## 2021-05-21 VITALS — BP 106/62 | HR 79 | Ht 63.5 in | Wt 179.0 lb

## 2021-05-21 DIAGNOSIS — R079 Chest pain, unspecified: Secondary | ICD-10-CM | POA: Diagnosis not present

## 2021-05-21 DIAGNOSIS — E119 Type 2 diabetes mellitus without complications: Secondary | ICD-10-CM

## 2021-05-21 DIAGNOSIS — I471 Supraventricular tachycardia: Secondary | ICD-10-CM

## 2021-05-21 DIAGNOSIS — R55 Syncope and collapse: Secondary | ICD-10-CM

## 2021-05-21 DIAGNOSIS — J3089 Other allergic rhinitis: Secondary | ICD-10-CM | POA: Diagnosis not present

## 2021-05-21 DIAGNOSIS — E782 Mixed hyperlipidemia: Secondary | ICD-10-CM | POA: Diagnosis not present

## 2021-05-21 DIAGNOSIS — Z794 Long term (current) use of insulin: Secondary | ICD-10-CM

## 2021-05-21 DIAGNOSIS — E669 Obesity, unspecified: Secondary | ICD-10-CM | POA: Diagnosis not present

## 2021-05-21 DIAGNOSIS — J301 Allergic rhinitis due to pollen: Secondary | ICD-10-CM | POA: Diagnosis not present

## 2021-05-21 DIAGNOSIS — R072 Precordial pain: Secondary | ICD-10-CM

## 2021-05-21 LAB — BASIC METABOLIC PANEL
BUN/Creatinine Ratio: 27 (ref 12–28)
BUN: 20 mg/dL (ref 8–27)
CO2: 25 mmol/L (ref 20–29)
Calcium: 10.1 mg/dL (ref 8.7–10.3)
Chloride: 101 mmol/L (ref 96–106)
Creatinine, Ser: 0.75 mg/dL (ref 0.57–1.00)
Glucose: 193 mg/dL — ABNORMAL HIGH (ref 70–99)
Potassium: 4.8 mmol/L (ref 3.5–5.2)
Sodium: 140 mmol/L (ref 134–144)
eGFR: 83 mL/min/{1.73_m2} (ref >59)

## 2021-05-21 MED ORDER — METOPROLOL TARTRATE 50 MG PO TABS: ORAL_TABLET | ORAL | 0 refills | Status: DC

## 2021-05-21 NOTE — Patient Instructions (Addendum)
Medication Instructions:  No changes  *If you need a refill on your cardiac medications before your next appointment, please call your pharmacy*  Follow-Up: At [facility], you and your health needs are our priority.  As part of our continuing mission to provide you with exceptional heart care, we have created designated Provider Care Teams.  These Care Teams include your primary Cardiologist (physician) and Advanced Practice Providers (APPs -  Physician Assistants and Nurse Practitioners) who all work together to provide you with the care you need, when you need it.  We recommend signing up for the patient portal called "MyChart".  Sign up information is provided on this After Visit Summary.  MyChart is used to connect with patients for Virtual Visits (Telemedicine).  Patients are able to view lab/test results, encounter notes, upcoming appointments, etc.  Non-urgent messages can be sent to your provider as well.   To learn more about what you can do with MyChart, go to [email or web address].    Your next appointment:   12 month(s)  The format for your next appointment:   In Person  Provider:   Sanda Klein, MD {   Other Instructions   Your cardiac CT will be scheduled at one of the below locations:   [facility] [address] [place], [place] [address] [phone]  [place] [address] [place], [place] [address] [phone]  If scheduled at [facility], please arrive at the [facility] main entrance (entrance A) of [facility] 30 minutes prior to test start time. You can use the FREE valet parking offered at the main entrance (encouraged to control the heart rate for the test) Proceed to the [facility] Radiology Department (first floor) to check-in and test prep.  If scheduled at [facility], please arrive 15 mins early for check-in and test  prep.  Please follow these instructions carefully (unless otherwise directed):  On the Night Before the Test: Be sure to Drink plenty of water. Do not consume any caffeinated/decaffeinated beverages or chocolate 12 hours prior to your test. Do not take any antihistamines 12 hours prior to your test.  On the Day of the Test: Drink plenty of water until 1 hour prior to the test. Do not eat any food 4 hours prior to the test. You may take your regular medications prior to the test.  Take metoprolol (Lopressor) and the Corlanor (2 tablets to equal 10 mg) two hours prior to test. -5 mg samples given of Corlanor (please take 10 mg) FEMALES- please wear underwire-free bra if available, avoid dresses & tight clothing Hold the Losartan the morning of the test      After the Test: Drink plenty of water. After receiving IV contrast, you may experience a mild flushed feeling. This is normal. On occasion, you may experience a mild rash up to 24 hours after the test. This is not dangerous. If this occurs, you can take Benadryl 25 mg and increase your fluid intake. If you experience trouble breathing, this can be serious. If it is severe call 911 IMMEDIATELY. If it is mild, please call our office. If you take any of these medications: Glipizide/Metformin, Avandament, Glucavance, please do not take 48 hours after completing test unless otherwise instructed.  We will call to schedule your test 2-4 weeks out understanding that some insurance companies will need an authorization prior to the service being performed.   For non-scheduling related questions,  please contact the cardiac imaging nurse navigator should you have any questions/concerns: Marchia Bond, Cardiac Imaging Nurse Navigator Gordy Clement, Cardiac Imaging Nurse Navigator [facility] Direct Office Dial: [phone]   For scheduling needs, including cancellations and rescheduling, please call [place],  [phone].

## 2021-05-21 NOTE — Progress Notes (Signed)
Cardiology Office Note    Date:  05/22/2021   ID:  Brenda Hatfield, Brenda Hatfield 1945/12/10, MRN [id number]  PCP:  Celene Squibb, MD  Cardiologist:   Sanda Klein, MD   Chief Complaint  Patient presents with   Chest Pain    History of Present Illness:  Brenda Hatfield is a 76 y.o. female with a history of type 2 diabetes mellitus, dyslipidemia, obesity, supraventricular tachycardia and neurally mediated syncope, presenting today after having several episodes of chest discomfort over the last few weeks.  She initially noticed symptoms when she was rushing into a grocery store during cold weather.  She develops tightness in the interscapular area that radiates to her anterior chest.  Feels like pressure.  It is "not too bad".  It subsides with rest.  Since then it has recurred about 6 or 7 times, always when she is walking, never at rest.  She has not had problems of shortness of breath or any chest discomforts at rest.  She denies edema, orthopnea, PND, palpitations or focal neurological complaints.  She has not had claudication.  She does not have hypertension, but takes losartan for renal function protection from diabetic nephropathy.  Similarly, she takes metoprolol for history of SVT, not for hypertension.  She has not had any recent syncopal events but she has had 2 previous episodes highly suggestive of vasovagal syncope, with typical prodrome and without palpitations.  Most recent syncope occurred when she was getting a pedicure a few years ago.  She has had episodes of presyncope her whole life but is able to abort them by sitting down when she feels the prodromal symptoms.  Her most recent hemoglobin A1c was a little high at 7.2%.  She takes both an SGLT2 inhibitor (Jardiance) and the GLP-1 agonist (liraglutide) as well as metformin and insulin.  Her LDL cholesterol was excellent at 63.  She does not smoke.   A nuclear stress test in 2012 showed breast attenuation artifact, but was  otherwise normal. An echocardiogram in 2001 was a mostly normal study: Systolic function, HB>71%, very mild left ventricular hypertrophy. There was mild mitral annular calcification and trivial MR, aortic valve sclerosis without stenosis which likely explains cardiac murmur. The left atrium was mildly enlarged at 4.5 cm.   Past Medical History:  Diagnosis Date   Chest pain 09/16/2010   normal myocardial perfusion study EF=61%   Diabetes mellitus ([place])    type 2   GERD (gastroesophageal reflux disease)    History of cardiac murmur 02/23/00   2D Echo EF greater than 55%   Hyperlipemia    Obesity    Osteopenia 06/2017   T score -1.2 FRAX 9% / 1.1%   Palpitation    Seasonal allergies    SVT (supraventricular tachycardia) ([place])    Systemic hypertension     Past Surgical History:  Procedure Laterality Date   CATARACT EXTRACTION Right 2003   [facility]   CATARACT EXTRACTION W/PHACO Left 04/09/2013   Procedure: LEFT EYE CATARACT EXTRACTION PHACO AND INTRAOCULAR LENS PLACEMENT ;  Surgeon: Tonny Branch, MD;  Location: [facility];  Service: Ophthalmology;  Laterality: Left;  CDE 10.78   MOUTH SURGERY  04/2017   spot removed by Dr. Buelah Manis    TONSILLECTOMY  [phone]   TUBAL LIGATION  1980    Current Medications: Outpatient Medications Prior to Visit  Medication Sig Dispense Refill   aspirin 81 MG tablet Take 81 mg by mouth daily.     atorvastatin (LIPITOR)  80 MG tablet Take 80 mg by mouth daily.     empagliflozin (JARDIANCE) 10 MG TABS tablet Take 10 mg by mouth daily.     glucose 4 GM chewable tablet Chew 0.5 tablets by mouth as needed for low blood sugar.     insulin aspart (NOVOLOG) 100 UNIT/ML injection Inject 8 Units into the skin 3 (three) times daily before meals.     Insulin Degludec-Liraglutide (XULTOPHY) 100-3.6 UNIT-MG/ML SOPN Inject 28-32 Units into the skin daily.     levocetirizine (XYZAL) 5 MG tablet Take 5 mg by mouth every evening.     losartan (COZAAR) 25 MG tablet Take 25 mg  by mouth daily.     metFORMIN (GLUCOPHAGE) 1000 MG tablet Take 1,000 mg by mouth 2 (two) times daily with a meal.     metoprolol succinate (TOPROL-XL) 25 MG 24 hr tablet Take 0.5 tablets (12.5 mg total) by mouth daily. 45 tablet 3   Multiple Vitamin (MULTIVITAMIN) capsule Take 1 capsule by mouth daily.     omeprazole (PRILOSEC) 20 MG capsule Take 20 mg by mouth every other day.      azelastine (OPTIVAR) 0.05 % ophthalmic solution Apply 1 drop to eye 2 (two) times daily.     EPINEPHrine 0.3 mg/0.3 mL IJ SOAJ injection See admin instructions.     FLUoxetine (PROZAC) 20 MG capsule Take 20 mg by mouth every morning.     No facility-administered medications prior to visit.     Allergies:   Patient has no known allergies.   Social History   Socioeconomic History   Marital status: Married    Spouse name: Not on file   Number of children: Not on file   Years of education: Not on file   Highest education level: Not on file  Occupational History   Not on file  Tobacco Use   Smoking status: Never   Smokeless tobacco: Never  Vaping Use   Vaping Use: Never used  Substance and Sexual Activity   Alcohol use: No   Drug use: No   Sexual activity: Yes    Partners: Male    Birth control/protection: None    Comment: First sexual intercouse at 76 yrs old. Less than partnere in a life time.   Other Topics Concern   Not on file  Social History Narrative   Not on file   Social Determinants of Health   Financial Resource Strain: Not on file  Food Insecurity: Not on file  Transportation Needs: Not on file  Physical Activity: Not on file  Stress: Not on file  Social Connections: Not on file     Family History:  The patient's family history includes Diabetes in her father; Parkinson's disease in her father.   ROS:   Please see the history of present illness.    ROS All other systems reviewed and are negative.   PHYSICAL EXAM:   VS:  BP 106/62    Pulse 79    Ht 5' 3.5" (1.613 m)    Wt  179 lb (81.2 kg)    SpO2 93%    BMI 31.21 kg/m      General: Alert, oriented x3, no distress, mildly obese Head: no evidence of trauma, PERRL, EOMI, no exophtalmos or lid lag, no myxedema, no xanthelasma; normal ears, nose and oropharynx Neck: normal jugular venous pulsations and no hepatojugular reflux; brisk carotid pulses without delay and no carotid bruits Chest: clear to auscultation, no signs of consolidation by percussion or palpation, normal  fremitus, symmetrical and full respiratory excursions Cardiovascular: normal position and quality of the apical impulse, regular rhythm, normal first and second heart sounds, no murmurs, rubs or gallops Abdomen: no tenderness or distention, no masses by palpation, no abnormal pulsatility or arterial bruits, normal bowel sounds, no hepatosplenomegaly Extremities: no clubbing, cyanosis or edema; 2+ radial, ulnar and brachial pulses bilaterally; 2+ right femoral, posterior tibial and dorsalis pedis pulses; 2+ left femoral, posterior tibial and dorsalis pedis pulses; no subclavian or femoral bruits Neurological: grossly nonfocal Psych: Normal mood and affect    Wt Readings from Last 3 Encounters:  05/21/21 179 lb (81.2 kg)  06/18/20 179 lb (81.2 kg)  05/30/19 174 lb 6.4 oz (79.1 kg)      Studies/Labs Reviewed:   EKG:  EKG is ordered today.  It shows normal sinus rhythm and is a completely normal tracing.  Recent Labs: 05/21/2021: BUN 20; Creatinine, Ser 0.75; Potassium 4.8; Sodium 140  02/21/2021 Hemoglobin A1c 7.2% Lipid Panel    Component Value Date/Time   CHOL 189 03/11/2008 2045   TRIG 180 (H) 03/11/2008 2045   HDL 69 03/11/2008 2045   CHOLHDL 2.7 Ratio 03/11/2008 2045   VLDL 36 03/11/2008 2045   LDLCALC 84 03/11/2008 2045    10/24/2020 Cholesterol 137, HDL 45, LDL 63, triglycerides 172  ASSESSMENT:    1. Chest pain on exertion   2. Vasovagal syncope   3. Controlled type 2 diabetes mellitus without complication, with  long-term current use of insulin ([place])   4. SVT (supraventricular tachycardia) ([place])   5. Mixed hyperlipidemia   6. Mild obesity   7. Precordial pain       PLAN:  In order of problems listed above:  Angina pectoris: Her symptoms are strongly suggestive of exertional angina, CCS class II.  She has numerous coronary risk factors including age, hypercholesterolemia and insulin requiring diabetes mellitus.  She has normal renal function.  Recommend coronary CT angiography.  If she has evidence of severe stenosis in an important blood vessel, we will schedule for coronary angiography.  If she appears to have angina related to stenosis in a secondary branch, we can try to increase her dose of beta-blocker again, but may need to stop her losartan if she develops hypotension. Syncope: When she started Jardiance she developed some hypotension and we cut back on her beta-blocker.  She has not had syncope recently. DM: Glycemic control is not perfect, but is fair.  On ARB for renal protection. SVT: Has not recurred even on the lower dose of beta-blocker. HLP: LDL less than 70.  Continue statin. Obesity: Lost some weight with Jardiance and liraglutide, weight has stabilized at a BMI of 31.     Medication Adjustments/Labs and Tests Ordered: Current medicines are reviewed at length with the patient today.  Concerns regarding medicines are outlined above.  Medication changes, Labs and Tests ordered today are listed in the Patient Instructions below. Patient Instructions  Medication Instructions:  No changes  *If you need a refill on your cardiac medications before your next appointment, please call your pharmacy*  Follow-Up: At [facility], you and your health needs are our priority.  As part of our continuing mission to provide you with exceptional heart care, we have created designated Provider Care Teams.  These Care Teams include your primary Cardiologist (physician) and Advanced Practice  Providers (APPs -  Physician Assistants and Nurse Practitioners) who all work together to provide you with the care you need, when you need it.  We recommend signing up for the patient portal called "MyChart".  Sign up information is provided on this After Visit Summary.  MyChart is used to connect with patients for Virtual Visits (Telemedicine).  Patients are able to view lab/test results, encounter notes, upcoming appointments, etc.  Non-urgent messages can be sent to your provider as well.   To learn more about what you can do with MyChart, go to [email or web address].    Your next appointment:   12 month(s)  The format for your next appointment:   In Person  Provider:   Sanda Klein, MD {   Other Instructions   Your cardiac CT will be scheduled at one of the below locations:   [facility] [address] [place], [place] [address] [phone]  [facility] [address] [place], [place] [address] [phone]  If scheduled at [facility], please arrive at the [facility] main entrance (entrance A) of [facility] 30 minutes prior to test start time. You can use the FREE valet parking offered at the main entrance (encouraged to control the heart rate for the test) Proceed to the [facility] Radiology Department (first floor) to check-in and test prep.  If scheduled at [facility], please arrive 15 mins early for check-in and test prep.  Please follow these instructions carefully (unless otherwise directed):  On the Night Before the Test: Be sure to Drink plenty of water. Do not consume any caffeinated/decaffeinated beverages or chocolate 12 hours prior to your test. Do not take any antihistamines 12 hours prior to your test.  On the Day of the Test: Drink plenty of water until 1 hour prior to the test. Do not eat any food 4 hours prior to the test. You may take  your regular medications prior to the test.  Take metoprolol (Lopressor) and the Corlanor (2 tablets to equal 10 mg) two hours prior to test. -5 mg samples given of Corlanor (please take 10 mg) FEMALES- please wear underwire-free bra if available, avoid dresses & tight clothing Hold the Losartan the morning of the test      After the Test: Drink plenty of water. After receiving IV contrast, you may experience a mild flushed feeling. This is normal. On occasion, you may experience a mild rash up to 24 hours after the test. This is not dangerous. If this occurs, you can take Benadryl 25 mg and increase your fluid intake. If you experience trouble breathing, this can be serious. If it is severe call 911 IMMEDIATELY. If it is mild, please call our office. If you take any of these medications: Glipizide/Metformin, Avandament, Glucavance, please do not take 48 hours after completing test unless otherwise instructed.  We will call to schedule your test 2-4 weeks out understanding that some insurance companies will need an authorization prior to the service being performed.   For non-scheduling related questions, please contact the cardiac imaging nurse navigator should you have any questions/concerns: Marchia Bond, Cardiac Imaging Nurse Navigator Gordy Clement, Cardiac Imaging Nurse Navigator [facility] Direct Office Dial: [phone]   For scheduling needs, including cancellations and rescheduling, please call [place], [phone].     Signed, Sanda Klein, MD  05/22/2021 6:30 PM    [facility] [place], [place], [place]  [address] Phone: [phone]; Fax: [phone]

## 2021-05-22 ENCOUNTER — Encounter: Payer: Self-pay | Admitting: Cardiovascular Disease

## 2021-05-27 DIAGNOSIS — M25561 Pain in right knee: Secondary | ICD-10-CM | POA: Diagnosis not present

## 2021-05-27 DIAGNOSIS — M25562 Pain in left knee: Secondary | ICD-10-CM | POA: Diagnosis not present

## 2021-05-28 DIAGNOSIS — J301 Allergic rhinitis due to pollen: Secondary | ICD-10-CM | POA: Diagnosis not present

## 2021-05-28 DIAGNOSIS — J3089 Other allergic rhinitis: Secondary | ICD-10-CM | POA: Diagnosis not present

## 2021-05-29 ENCOUNTER — Telehealth (HOSPITAL_COMMUNITY): Payer: Self-pay | Admitting: *Deleted

## 2021-05-29 NOTE — Telephone Encounter (Signed)
Reaching out to patient to offer assistance regarding upcoming cardiac imaging study; pt verbalizes understanding of appt date/time, parking situation and where to check in, pre-test NPO status and medications ordered, and verified current allergies; name and call back number provided for further questions should they arise  Gordy Clement RN Navigator Cardiac Imaging [facility] [phone] office [phone] cell  Patient to hold her losartan x 2 days and daily metoprolol succinate on day of test. She is to take 50mg  metoprolol tartrate and 10mg  ivabradine two hours prior to her cardiac CT scan. She is aware to arrive at 9am for her 9:30am scan.

## 2021-06-02 ENCOUNTER — Ambulatory Visit (HOSPITAL_COMMUNITY)
Admission: RE | Admit: 2021-06-02 | Discharge: 2021-06-02 | Disposition: A | Discharge status: Home / Self Care | Payer: PPO | Source: Ambulatory Visit | Attending: Cardiovascular Disease | Admitting: Cardiovascular Disease

## 2021-06-02 ENCOUNTER — Other Ambulatory Visit: Payer: Self-pay

## 2021-06-02 DIAGNOSIS — I700 Atherosclerosis of aorta: Secondary | ICD-10-CM | POA: Diagnosis not present

## 2021-06-02 DIAGNOSIS — R072 Precordial pain: Secondary | ICD-10-CM | POA: Insufficient documentation

## 2021-06-02 MED ORDER — NITROGLYCERIN 0.4 MG SL SUBL
SUBLINGUAL_TABLET | SUBLINGUAL | Status: AC
  Administered 2021-06-02: 0.8 mg via SUBLINGUAL
  Filled 2021-06-02: qty 2

## 2021-06-02 MED ORDER — IOHEXOL 350 MG/ML SOLN
100.0000 mL | Freq: Once | INTRAVENOUS | Status: AC | PRN
  Administered 2021-06-02: 100 mL via INTRAVENOUS

## 2021-06-02 MED ORDER — NITROGLYCERIN 0.4 MG SL SUBL: 0.8000 mg | SUBLINGUAL_TABLET | Freq: Once | SUBLINGUAL | Status: AC

## 2021-06-03 ENCOUNTER — Telehealth: Payer: Self-pay | Admitting: Cardiovascular Disease

## 2021-06-03 NOTE — Telephone Encounter (Signed)
Radiology called into triage to make Dr. Sallyanne Kuster aware that they have made an addendum to the patient's coronary CT.

## 2021-06-03 NOTE — Telephone Encounter (Signed)
° °  Brenda Hatfield radiology calling to give CT result

## 2021-06-05 DIAGNOSIS — J3089 Other allergic rhinitis: Secondary | ICD-10-CM | POA: Diagnosis not present

## 2021-06-05 DIAGNOSIS — J301 Allergic rhinitis due to pollen: Secondary | ICD-10-CM | POA: Diagnosis not present

## 2021-06-10 DIAGNOSIS — M170 Bilateral primary osteoarthritis of knee: Secondary | ICD-10-CM | POA: Diagnosis not present

## 2021-06-12 DIAGNOSIS — J3089 Other allergic rhinitis: Secondary | ICD-10-CM | POA: Diagnosis not present

## 2021-06-12 DIAGNOSIS — J301 Allergic rhinitis due to pollen: Secondary | ICD-10-CM | POA: Diagnosis not present

## 2021-06-16 DIAGNOSIS — M259 Joint disorder, unspecified: Secondary | ICD-10-CM | POA: Diagnosis not present

## 2021-06-16 DIAGNOSIS — M545 Low back pain, unspecified: Secondary | ICD-10-CM | POA: Diagnosis not present

## 2021-06-21 ENCOUNTER — Ambulatory Visit
Admission: EM | Admit: 2021-06-21 | Discharge: 2021-06-21 | Disposition: A | Discharge status: Home / Self Care | Payer: PPO | Attending: Urgent Care | Admitting: Urgent Care

## 2021-06-21 ENCOUNTER — Other Ambulatory Visit: Payer: Self-pay

## 2021-06-21 DIAGNOSIS — M25551 Pain in right hip: Secondary | ICD-10-CM

## 2021-06-21 DIAGNOSIS — M545 Low back pain, unspecified: Secondary | ICD-10-CM | POA: Diagnosis not present

## 2021-06-21 DIAGNOSIS — Z794 Long term (current) use of insulin: Secondary | ICD-10-CM

## 2021-06-21 DIAGNOSIS — E119 Type 2 diabetes mellitus without complications: Secondary | ICD-10-CM

## 2021-06-21 DIAGNOSIS — M792 Neuralgia and neuritis, unspecified: Secondary | ICD-10-CM

## 2021-06-21 LAB — POCT URINALYSIS DIP (MANUAL ENTRY)
Bilirubin, UA: NEGATIVE
Blood, UA: NEGATIVE
Glucose, UA: >=1000 mg/dL — AB
Ketones, POC UA: NEGATIVE mg/dL
Leukocytes, UA: NEGATIVE
Nitrite, UA: NEGATIVE
Protein Ur, POC: NEGATIVE mg/dL
Spec Grav, UA: 1.015 (ref 1.010–1.025)
Urobilinogen, UA: 0.2 E.U./dL
pH, UA: 5.5 (ref 5.0–8.0)

## 2021-06-21 LAB — POCT FASTING CBG KUC MANUAL ENTRY: POCT Glucose (KUC): 189 mg/dL — AB (ref 70–99)

## 2021-06-21 MED ORDER — CYCLOBENZAPRINE HCL 5 MG PO TABS: 5.0000 mg | ORAL_TABLET | Freq: Three times a day (TID) | ORAL | 0 refills | Status: DC | PRN

## 2021-06-21 MED ORDER — GABAPENTIN (ONCE-DAILY) 300 MG PO TABS: 1.0000 | ORAL_TABLET | Freq: Two times a day (BID) | ORAL | 0 refills | Status: DC | PRN

## 2021-06-21 MED ORDER — TRAMADOL HCL 50 MG PO TABS: 50.0000 mg | ORAL_TABLET | Freq: Four times a day (QID) | ORAL | 0 refills | Status: DC | PRN

## 2021-06-21 NOTE — ED Triage Notes (Signed)
Pt reports right sided lower back, goes down to right leg x 5 weeks. Pt reports she had a X-ray done at [facility] and they found "something" in her back. Norco and Advil gives no relief.  ?

## 2021-06-21 NOTE — ED Notes (Signed)
Pt requested urine test.  ?

## 2021-06-21 NOTE — ED Provider Notes (Addendum)
?[place] ? ? ?MRN: [id number] DOB: 1946/04/17 ? ?Subjective:  ? ?Brenda Hatfield is a 76 y.o. female presenting for 2-3 week history of persistent low back, right hip/buttock pain that radiates down into the right thigh. Has associated burning and tingling sensations of her right thigh that go down into the lower leg.  She has been seen for this through her orthopedist at [facility].  They have done imaging and patient reports that they found something abnormal but cannot recall what it was.  They were then a consult with another orthopedic provider but she has not heard about it.  Denies fall, trauma, weakness, changes to bowel or urinary habits.  Patient is a type II diabetic and is treated with insulin.  Patient had a piece of toast and a glass of milk this morning.  ? ?No current facility-administered medications for this encounter. ? ?Current Outpatient Medications:  ?  aspirin 81 MG tablet, Take 81 mg by mouth daily., Disp: , Rfl:  ?  atorvastatin (LIPITOR) 80 MG tablet, Take 80 mg by mouth daily., Disp: , Rfl:  ?  azelastine (OPTIVAR) 0.05 % ophthalmic solution, Apply 1 drop to eye 2 (two) times daily., Disp: , Rfl:  ?  empagliflozin (JARDIANCE) 10 MG TABS tablet, Take 10 mg by mouth daily., Disp: , Rfl:  ?  EPINEPHrine 0.3 mg/0.3 mL IJ SOAJ injection, See admin instructions., Disp: , Rfl:  ?  FLUoxetine (PROZAC) 20 MG capsule, Take 20 mg by mouth every morning., Disp: , Rfl:  ?  glucose 4 GM chewable tablet, Chew 0.5 tablets by mouth as needed for low blood sugar., Disp: , Rfl:  ?  HYDROcodone-acetaminophen (NORCO/VICODIN) 5-325 MG tablet, hydrocodone 5 mg-acetaminophen 325 mg tablet, Disp: , Rfl:  ?  insulin aspart (NOVOLOG) 100 UNIT/ML injection, Inject 8 Units into the skin 3 (three) times daily before meals., Disp: , Rfl:  ?  Insulin Degludec-Liraglutide (XULTOPHY) 100-3.6 UNIT-MG/ML SOPN, Inject 28-32 Units into the skin daily., Disp: , Rfl:  ?  levocetirizine (XYZAL) 5 MG tablet,  Take 5 mg by mouth every evening., Disp: , Rfl:  ?  losartan (COZAAR) 25 MG tablet, Take 25 mg by mouth daily., Disp: , Rfl:  ?  metFORMIN (GLUCOPHAGE) 1000 MG tablet, Take 1,000 mg by mouth 2 (two) times daily with a meal., Disp: , Rfl:  ?  metoprolol succinate (TOPROL-XL) 25 MG 24 hr tablet, Take 0.5 tablets (12.5 mg total) by mouth daily., Disp: 45 tablet, Rfl: 3 ?  metoprolol tartrate (LOPRESSOR) 50 MG tablet, Take one tablet two hours prior to the test, Disp: 1 tablet, Rfl: 0 ?  Multiple Vitamin (MULTIVITAMIN) capsule, Take 1 capsule by mouth daily., Disp: , Rfl:  ?  omeprazole (PRILOSEC) 20 MG capsule, Take 20 mg by mouth every other day. , Disp: , Rfl:   ? ?No Known Allergies ? ?Past Medical History:  ?Diagnosis Date  ? Chest pain 09/16/2010  ? normal myocardial perfusion study EF=61%  ? Diabetes mellitus ([place])   ? type 2  ? GERD (gastroesophageal reflux disease)   ? History of cardiac murmur 02/23/00  ? 2D Echo EF greater than 55%  ? Hyperlipemia   ? Obesity   ? Osteopenia 06/2017  ? T score -1.2 FRAX 9% / 1.1%  ? Palpitation   ? Seasonal allergies   ? SVT (supraventricular tachycardia) ([place])   ? Systemic hypertension   ?  ? ?Past Surgical History:  ?Procedure Laterality Date  ? CATARACT EXTRACTION Right  2003  ? [facility]  ? CATARACT EXTRACTION W/PHACO Left 04/09/2013  ? Procedure: LEFT EYE CATARACT EXTRACTION PHACO AND INTRAOCULAR LENS PLACEMENT ;  Surgeon: Tonny Branch, MD;  Location: [facility];  Service: Ophthalmology;  Laterality: Left;  CDE 10.78  ? MOUTH SURGERY  04/2017  ? spot removed by Dr. Buelah Manis   ? TONSILLECTOMY  [id number]  ? TUBAL LIGATION  1980  ? ? ?Family History  ?Problem Relation Age of Onset  ? Parkinson's disease Father   ? Diabetes Father   ? ? ?Social History  ? ?Tobacco Use  ? Smoking status: Never  ? Smokeless tobacco: Never  ?Vaping Use  ? Vaping Use: Never used  ?Substance Use Topics  ? Alcohol use: No  ? Drug use: No  ? ? ?ROS ? ? ?Objective:  ? ?Vitals: ?BP 137/83 (BP Location: Right  Arm)   Pulse 86   Resp 18   SpO2 93%  ? ?Physical Exam ?Constitutional:   ?   General: She is not in acute distress. ?   Appearance: Normal appearance. She is well-developed. She is not ill-appearing, toxic-appearing or diaphoretic.  ?HENT:  ?   Head: Normocephalic and atraumatic.  ?   Nose: Nose normal.  ?   Mouth/Throat:  ?   Mouth: Mucous membranes are moist.  ?Eyes:  ?   General: No scleral icterus.    ?   Right eye: No discharge.     ?   Left eye: No discharge.  ?   Extraocular Movements: Extraocular movements intact.  ?Cardiovascular:  ?   Rate and Rhythm: Normal rate.  ?Pulmonary:  ?   Effort: Pulmonary effort is normal.  ?Musculoskeletal:  ?   Lumbar back: No swelling, edema, deformity, signs of trauma, lacerations, spasms, tenderness or bony tenderness. Normal range of motion. Negative right straight leg raise test and negative left straight leg raise test. No scoliosis.  ?Skin: ?   General: Skin is warm and dry.  ?Neurological:  ?   General: No focal deficit present.  ?   Mental Status: She is alert and oriented to person, place, and time.  ?   Motor: No weakness.  ?   Coordination: Coordination normal.  ?   Gait: Gait normal.  ?   Deep Tendon Reflexes: Reflexes normal.  ?Psychiatric:     ?   Mood and Affect: Mood normal.     ?   Behavior: Behavior normal.  ? ? ?Results for orders placed or performed during the hospital encounter of 06/21/21 (from the past 24 hour(s))  ?POCT urinalysis dipstick     Status: Abnormal  ? Collection Time: 06/21/21  8:15 AM  ?Result Value Ref Range  ? Color, UA yellow yellow  ? Clarity, UA clear clear  ? Glucose, UA >=1,000 (A) negative mg/dL  ? Bilirubin, UA negative negative  ? Ketones, POC UA negative negative mg/dL  ? Spec Grav, UA 1.015 1.010 - 1.025  ? Blood, UA negative negative  ? pH, UA 5.5 5.0 - 8.0  ? Protein Ur, POC negative negative mg/dL  ? Urobilinogen, UA 0.2 0.2 or 1.0 E.U./dL  ? Nitrite, UA Negative Negative  ? Leukocytes, UA Negative Negative   ? ? ?Assessment and Plan :  ? ?I have reviewed the PDMP during this encounter. ? ?1. Acute right-sided low back pain without sciatica   ?2. Acute right hip pain   ?3. Type 2 diabetes mellitus treated with insulin ([place])   ?4. Neuropathic pain   ? ?  I advised that we avoid the use of steroids given her uncontrolled diabetes. Recommended triple therapy with Tylenol, Tramadol for breakthrough pain; gabapentin and Flexeril. Follow up with [facility] asap for a recheck and consideration of an MRI. Counseled patient on potential for adverse effects with medications prescribed/recommended today, ER and return-to-clinic precautions discussed, patient verbalized understanding. ? ? ?  ?Jaynee Eagles, PA-C ?06/21/21 0301 ? ?

## 2021-06-21 NOTE — Discharge Instructions (Addendum)
Please schedule Tylenol at 500 mg - 650 mg once every 6 hours as needed for aches and pains.  If you still have pain despite taking Tylenol regularly, this is breakthrough pain.  You can use tramadol once every 6 hours for this.  Once your pain is better controlled, switch back to just Tylenol.  You can also use gabapentin for your nerve type pain.  Use Flexeril as a muscle relaxant as needed.  Make sure you follow-up with your orthopedist at [facility] for consideration of an MRI and a recheck on your back pain and buttock/hip pain. ? ?

## 2021-06-25 DIAGNOSIS — J3089 Other allergic rhinitis: Secondary | ICD-10-CM | POA: Diagnosis not present

## 2021-06-25 DIAGNOSIS — J301 Allergic rhinitis due to pollen: Secondary | ICD-10-CM | POA: Diagnosis not present

## 2021-07-01 DIAGNOSIS — M533 Sacrococcygeal disorders, not elsewhere classified: Secondary | ICD-10-CM | POA: Diagnosis not present

## 2021-07-03 DIAGNOSIS — J301 Allergic rhinitis due to pollen: Secondary | ICD-10-CM | POA: Diagnosis not present

## 2021-07-03 DIAGNOSIS — J3089 Other allergic rhinitis: Secondary | ICD-10-CM | POA: Diagnosis not present

## 2021-07-03 DIAGNOSIS — J3081 Allergic rhinitis due to animal (cat) (dog) hair and dander: Secondary | ICD-10-CM | POA: Diagnosis not present

## 2021-07-10 DIAGNOSIS — I10 Essential (primary) hypertension: Secondary | ICD-10-CM | POA: Diagnosis not present

## 2021-07-10 DIAGNOSIS — E1169 Type 2 diabetes mellitus with other specified complication: Secondary | ICD-10-CM | POA: Diagnosis not present

## 2021-07-10 DIAGNOSIS — J3089 Other allergic rhinitis: Secondary | ICD-10-CM | POA: Diagnosis not present

## 2021-07-10 DIAGNOSIS — E782 Mixed hyperlipidemia: Secondary | ICD-10-CM | POA: Diagnosis not present

## 2021-07-10 DIAGNOSIS — J301 Allergic rhinitis due to pollen: Secondary | ICD-10-CM | POA: Diagnosis not present

## 2021-07-14 DIAGNOSIS — E1169 Type 2 diabetes mellitus with other specified complication: Secondary | ICD-10-CM | POA: Diagnosis not present

## 2021-07-14 DIAGNOSIS — E782 Mixed hyperlipidemia: Secondary | ICD-10-CM | POA: Diagnosis not present

## 2021-07-14 DIAGNOSIS — F329 Major depressive disorder, single episode, unspecified: Secondary | ICD-10-CM | POA: Diagnosis not present

## 2021-07-14 DIAGNOSIS — I10 Essential (primary) hypertension: Secondary | ICD-10-CM | POA: Diagnosis not present

## 2021-07-14 DIAGNOSIS — E669 Obesity, unspecified: Secondary | ICD-10-CM | POA: Diagnosis not present

## 2021-07-14 DIAGNOSIS — I209 Angina pectoris, unspecified: Secondary | ICD-10-CM | POA: Diagnosis not present

## 2021-07-14 DIAGNOSIS — J309 Allergic rhinitis, unspecified: Secondary | ICD-10-CM | POA: Diagnosis not present

## 2021-07-14 DIAGNOSIS — E875 Hyperkalemia: Secondary | ICD-10-CM | POA: Diagnosis not present

## 2021-07-14 DIAGNOSIS — M25561 Pain in right knee: Secondary | ICD-10-CM | POA: Diagnosis not present

## 2021-07-14 DIAGNOSIS — E1140 Type 2 diabetes mellitus with diabetic neuropathy, unspecified: Secondary | ICD-10-CM | POA: Diagnosis not present

## 2021-07-14 DIAGNOSIS — D509 Iron deficiency anemia, unspecified: Secondary | ICD-10-CM | POA: Diagnosis not present

## 2021-07-14 DIAGNOSIS — M8580 Other specified disorders of bone density and structure, unspecified site: Secondary | ICD-10-CM | POA: Diagnosis not present

## 2021-07-15 DIAGNOSIS — J301 Allergic rhinitis due to pollen: Secondary | ICD-10-CM | POA: Diagnosis not present

## 2021-07-15 DIAGNOSIS — H1045 Other chronic allergic conjunctivitis: Secondary | ICD-10-CM | POA: Diagnosis not present

## 2021-07-15 DIAGNOSIS — J3089 Other allergic rhinitis: Secondary | ICD-10-CM | POA: Diagnosis not present

## 2021-07-23 DIAGNOSIS — J3089 Other allergic rhinitis: Secondary | ICD-10-CM | POA: Diagnosis not present

## 2021-07-23 DIAGNOSIS — J301 Allergic rhinitis due to pollen: Secondary | ICD-10-CM | POA: Diagnosis not present

## 2021-07-29 ENCOUNTER — Ambulatory Visit: Payer: PPO | Admitting: Cardiovascular Disease

## 2021-07-31 DIAGNOSIS — J3089 Other allergic rhinitis: Secondary | ICD-10-CM | POA: Diagnosis not present

## 2021-07-31 DIAGNOSIS — J301 Allergic rhinitis due to pollen: Secondary | ICD-10-CM | POA: Diagnosis not present

## 2021-08-06 DIAGNOSIS — J3089 Other allergic rhinitis: Secondary | ICD-10-CM | POA: Diagnosis not present

## 2021-08-06 DIAGNOSIS — J3081 Allergic rhinitis due to animal (cat) (dog) hair and dander: Secondary | ICD-10-CM | POA: Diagnosis not present

## 2021-08-06 DIAGNOSIS — J301 Allergic rhinitis due to pollen: Secondary | ICD-10-CM | POA: Diagnosis not present

## 2021-08-13 DIAGNOSIS — J3081 Allergic rhinitis due to animal (cat) (dog) hair and dander: Secondary | ICD-10-CM | POA: Diagnosis not present

## 2021-08-13 DIAGNOSIS — J301 Allergic rhinitis due to pollen: Secondary | ICD-10-CM | POA: Diagnosis not present

## 2021-08-13 DIAGNOSIS — J3089 Other allergic rhinitis: Secondary | ICD-10-CM | POA: Diagnosis not present

## 2021-08-21 DIAGNOSIS — J3089 Other allergic rhinitis: Secondary | ICD-10-CM | POA: Diagnosis not present

## 2021-08-21 DIAGNOSIS — J301 Allergic rhinitis due to pollen: Secondary | ICD-10-CM | POA: Diagnosis not present

## 2021-08-21 DIAGNOSIS — J3081 Allergic rhinitis due to animal (cat) (dog) hair and dander: Secondary | ICD-10-CM | POA: Diagnosis not present

## 2021-08-25 DIAGNOSIS — J301 Allergic rhinitis due to pollen: Secondary | ICD-10-CM | POA: Diagnosis not present

## 2021-08-25 DIAGNOSIS — J3089 Other allergic rhinitis: Secondary | ICD-10-CM | POA: Diagnosis not present

## 2021-08-28 DIAGNOSIS — J3089 Other allergic rhinitis: Secondary | ICD-10-CM | POA: Diagnosis not present

## 2021-08-28 DIAGNOSIS — E1169 Type 2 diabetes mellitus with other specified complication: Secondary | ICD-10-CM | POA: Diagnosis not present

## 2021-08-28 DIAGNOSIS — J301 Allergic rhinitis due to pollen: Secondary | ICD-10-CM | POA: Diagnosis not present

## 2021-09-04 DIAGNOSIS — J301 Allergic rhinitis due to pollen: Secondary | ICD-10-CM | POA: Diagnosis not present

## 2021-09-04 DIAGNOSIS — J3089 Other allergic rhinitis: Secondary | ICD-10-CM | POA: Diagnosis not present

## 2021-09-04 DIAGNOSIS — J3081 Allergic rhinitis due to animal (cat) (dog) hair and dander: Secondary | ICD-10-CM | POA: Diagnosis not present

## 2021-09-10 DIAGNOSIS — J301 Allergic rhinitis due to pollen: Secondary | ICD-10-CM | POA: Diagnosis not present

## 2021-09-10 DIAGNOSIS — M25562 Pain in left knee: Secondary | ICD-10-CM | POA: Diagnosis not present

## 2021-09-10 DIAGNOSIS — M1711 Unilateral primary osteoarthritis, right knee: Secondary | ICD-10-CM | POA: Diagnosis not present

## 2021-09-10 DIAGNOSIS — J3089 Other allergic rhinitis: Secondary | ICD-10-CM | POA: Diagnosis not present

## 2021-09-10 DIAGNOSIS — M7061 Trochanteric bursitis, right hip: Secondary | ICD-10-CM | POA: Diagnosis not present

## 2021-09-17 DIAGNOSIS — J3089 Other allergic rhinitis: Secondary | ICD-10-CM | POA: Diagnosis not present

## 2021-09-17 DIAGNOSIS — J301 Allergic rhinitis due to pollen: Secondary | ICD-10-CM | POA: Diagnosis not present

## 2021-09-24 DIAGNOSIS — J3089 Other allergic rhinitis: Secondary | ICD-10-CM | POA: Diagnosis not present

## 2021-09-24 DIAGNOSIS — J301 Allergic rhinitis due to pollen: Secondary | ICD-10-CM | POA: Diagnosis not present

## 2021-10-01 DIAGNOSIS — J3089 Other allergic rhinitis: Secondary | ICD-10-CM | POA: Diagnosis not present

## 2021-10-01 DIAGNOSIS — J3081 Allergic rhinitis due to animal (cat) (dog) hair and dander: Secondary | ICD-10-CM | POA: Diagnosis not present

## 2021-10-01 DIAGNOSIS — J301 Allergic rhinitis due to pollen: Secondary | ICD-10-CM | POA: Diagnosis not present

## 2021-10-08 DIAGNOSIS — J3089 Other allergic rhinitis: Secondary | ICD-10-CM | POA: Diagnosis not present

## 2021-10-08 DIAGNOSIS — J3081 Allergic rhinitis due to animal (cat) (dog) hair and dander: Secondary | ICD-10-CM | POA: Diagnosis not present

## 2021-10-08 DIAGNOSIS — J301 Allergic rhinitis due to pollen: Secondary | ICD-10-CM | POA: Diagnosis not present

## 2021-10-15 DIAGNOSIS — J3081 Allergic rhinitis due to animal (cat) (dog) hair and dander: Secondary | ICD-10-CM | POA: Diagnosis not present

## 2021-10-15 DIAGNOSIS — J301 Allergic rhinitis due to pollen: Secondary | ICD-10-CM | POA: Diagnosis not present

## 2021-10-15 DIAGNOSIS — J3089 Other allergic rhinitis: Secondary | ICD-10-CM | POA: Diagnosis not present

## 2021-10-16 DIAGNOSIS — M25551 Pain in right hip: Secondary | ICD-10-CM | POA: Diagnosis not present

## 2021-10-23 DIAGNOSIS — J3081 Allergic rhinitis due to animal (cat) (dog) hair and dander: Secondary | ICD-10-CM | POA: Diagnosis not present

## 2021-10-23 DIAGNOSIS — J3089 Other allergic rhinitis: Secondary | ICD-10-CM | POA: Diagnosis not present

## 2021-10-23 DIAGNOSIS — J301 Allergic rhinitis due to pollen: Secondary | ICD-10-CM | POA: Diagnosis not present

## 2021-10-25 DIAGNOSIS — M25551 Pain in right hip: Secondary | ICD-10-CM | POA: Diagnosis not present

## 2021-10-30 DIAGNOSIS — J3089 Other allergic rhinitis: Secondary | ICD-10-CM | POA: Diagnosis not present

## 2021-10-30 DIAGNOSIS — J301 Allergic rhinitis due to pollen: Secondary | ICD-10-CM | POA: Diagnosis not present

## 2021-10-30 DIAGNOSIS — J3081 Allergic rhinitis due to animal (cat) (dog) hair and dander: Secondary | ICD-10-CM | POA: Diagnosis not present

## 2021-11-04 ENCOUNTER — Other Ambulatory Visit: Payer: Self-pay

## 2021-11-04 DIAGNOSIS — M8580 Other specified disorders of bone density and structure, unspecified site: Secondary | ICD-10-CM

## 2021-11-06 DIAGNOSIS — J301 Allergic rhinitis due to pollen: Secondary | ICD-10-CM | POA: Diagnosis not present

## 2021-11-06 DIAGNOSIS — J3081 Allergic rhinitis due to animal (cat) (dog) hair and dander: Secondary | ICD-10-CM | POA: Diagnosis not present

## 2021-11-06 DIAGNOSIS — J3089 Other allergic rhinitis: Secondary | ICD-10-CM | POA: Diagnosis not present

## 2021-11-09 DIAGNOSIS — Z1231 Encounter for screening mammogram for malignant neoplasm of breast: Secondary | ICD-10-CM | POA: Diagnosis not present

## 2021-11-11 ENCOUNTER — Encounter: Payer: Self-pay | Admitting: Obstetrics & Gynecology

## 2021-11-11 DIAGNOSIS — E782 Mixed hyperlipidemia: Secondary | ICD-10-CM | POA: Diagnosis not present

## 2021-11-11 DIAGNOSIS — E1169 Type 2 diabetes mellitus with other specified complication: Secondary | ICD-10-CM | POA: Diagnosis not present

## 2021-11-12 DIAGNOSIS — M24151 Other articular cartilage disorders, right hip: Secondary | ICD-10-CM | POA: Diagnosis not present

## 2021-11-12 DIAGNOSIS — M25551 Pain in right hip: Secondary | ICD-10-CM | POA: Diagnosis not present

## 2021-11-12 DIAGNOSIS — J301 Allergic rhinitis due to pollen: Secondary | ICD-10-CM | POA: Diagnosis not present

## 2021-11-12 DIAGNOSIS — J3089 Other allergic rhinitis: Secondary | ICD-10-CM | POA: Diagnosis not present

## 2021-11-12 DIAGNOSIS — J3081 Allergic rhinitis due to animal (cat) (dog) hair and dander: Secondary | ICD-10-CM | POA: Diagnosis not present

## 2021-11-18 DIAGNOSIS — I10 Essential (primary) hypertension: Secondary | ICD-10-CM | POA: Diagnosis not present

## 2021-11-18 DIAGNOSIS — F329 Major depressive disorder, single episode, unspecified: Secondary | ICD-10-CM | POA: Diagnosis not present

## 2021-11-18 DIAGNOSIS — D509 Iron deficiency anemia, unspecified: Secondary | ICD-10-CM | POA: Diagnosis not present

## 2021-11-18 DIAGNOSIS — E875 Hyperkalemia: Secondary | ICD-10-CM | POA: Diagnosis not present

## 2021-11-18 DIAGNOSIS — E782 Mixed hyperlipidemia: Secondary | ICD-10-CM | POA: Diagnosis not present

## 2021-11-18 DIAGNOSIS — E1169 Type 2 diabetes mellitus with other specified complication: Secondary | ICD-10-CM | POA: Diagnosis not present

## 2021-11-18 DIAGNOSIS — M8580 Other specified disorders of bone density and structure, unspecified site: Secondary | ICD-10-CM | POA: Diagnosis not present

## 2021-11-18 DIAGNOSIS — E669 Obesity, unspecified: Secondary | ICD-10-CM | POA: Diagnosis not present

## 2021-11-18 DIAGNOSIS — E1140 Type 2 diabetes mellitus with diabetic neuropathy, unspecified: Secondary | ICD-10-CM | POA: Diagnosis not present

## 2021-11-18 DIAGNOSIS — J309 Allergic rhinitis, unspecified: Secondary | ICD-10-CM | POA: Diagnosis not present

## 2021-11-18 DIAGNOSIS — I209 Angina pectoris, unspecified: Secondary | ICD-10-CM | POA: Diagnosis not present

## 2021-11-18 DIAGNOSIS — M25561 Pain in right knee: Secondary | ICD-10-CM | POA: Diagnosis not present

## 2021-11-20 DIAGNOSIS — J3081 Allergic rhinitis due to animal (cat) (dog) hair and dander: Secondary | ICD-10-CM | POA: Diagnosis not present

## 2021-11-20 DIAGNOSIS — J3089 Other allergic rhinitis: Secondary | ICD-10-CM | POA: Diagnosis not present

## 2021-11-20 DIAGNOSIS — J301 Allergic rhinitis due to pollen: Secondary | ICD-10-CM | POA: Diagnosis not present

## 2021-11-23 ENCOUNTER — Encounter: Payer: Self-pay | Admitting: Obstetrics & Gynecology

## 2021-11-23 ENCOUNTER — Ambulatory Visit (INDEPENDENT_AMBULATORY_CARE_PROVIDER_SITE_OTHER): Payer: PPO | Admitting: Obstetrics & Gynecology

## 2021-11-23 VITALS — BP 102/68 | HR 68 | Resp 12 | Ht 62.5 in | Wt 164.0 lb

## 2021-11-23 DIAGNOSIS — Z780 Asymptomatic menopausal state: Secondary | ICD-10-CM

## 2021-11-23 DIAGNOSIS — Z01419 Encounter for gynecological examination (general) (routine) without abnormal findings: Secondary | ICD-10-CM

## 2021-11-23 DIAGNOSIS — M85851 Other specified disorders of bone density and structure, right thigh: Secondary | ICD-10-CM

## 2021-11-23 NOTE — Progress Notes (Signed)
Brenda Hatfield 26-Jul-1945 [id number]   History:    76 y.o. G3P3L3 Married.  Has grand-children.   RP:  Established patient presenting for annual gyn exam    HPI: Postmenopause, well on no HRT.  No PMB.  No pelvic pain.  Abstinent x 4 years. No h/o abnormal Pap.  Last Pap Neg in 2018.  No indication to repeat a Pap at this time. Urine and bowel movements normal.  Breasts normal.  Mammo Neg 10/2021.  Body mass index 29.52.  Bone Density 06/2017 Osteopenia Rt Femoral Neck T-Score -1.2.  Scheduled for a BD here next week.  Health labs with family physician.  Cologard Neg in 2021.  Followed by Ortho for Sciatic pain and knee issues.    Past medical history,surgical history, family history and social history were all reviewed and documented in the EPIC chart.  Gynecologic History No LMP recorded. Patient is postmenopausal.  Obstetric History OB History  Gravida Para Term Preterm AB Living  3         3  SAB IAB Ectopic Multiple Live Births               # Outcome Date GA Lbr Len/2nd Weight Sex Delivery Anes PTL Lv  3 Gravida           2 Gravida           1 Gravida             ROS: A ROS was performed and pertinent positives and negatives are included in the history. GENERAL: No fevers or chills. HEENT: No change in vision, no earache, sore throat or sinus congestion. NECK: No pain or stiffness. CARDIOVASCULAR: No chest pain or pressure. No palpitations. PULMONARY: No shortness of breath, cough or wheeze. GASTROINTESTINAL: No abdominal pain, nausea, vomiting or diarrhea, melena or bright red blood per rectum. GENITOURINARY: No urinary frequency, urgency, hesitancy or dysuria. MUSCULOSKELETAL: No joint or muscle pain, no back pain, no recent trauma. DERMATOLOGIC: No rash, no itching, no lesions. ENDOCRINE: No polyuria, polydipsia, no heat or cold intolerance. No recent change in weight. HEMATOLOGICAL: No anemia or easy bruising or bleeding. NEUROLOGIC: No headache, seizures, numbness, tingling  or weakness. PSYCHIATRIC: No depression, no loss of interest in normal activity or change in sleep pattern.     Exam:   BP 102/68 (BP Location: Left Arm, Patient Position: Sitting, Cuff Size: Normal)   Pulse 68   Resp 12   Ht 5' 2.5" (1.588 m)   Wt 164 lb (74.4 kg)   BMI 29.52 kg/m   Body mass index is 29.52 kg/m.  General appearance : Well developed well nourished female. No acute distress HEENT: Eyes: no retinal hemorrhage or exudates,  Neck supple, trachea midline, no carotid bruits, no thyroidmegaly Lungs: Clear to auscultation, no rhonchi or wheezes, or rib retractions  Heart: Regular rate and rhythm, no murmurs or gallops Breast:Examined in sitting and supine position were symmetrical in appearance, no palpable masses or tenderness,  no skin retraction, no nipple inversion, no nipple discharge, no skin discoloration, no axillary or supraclavicular lymphadenopathy Abdomen: no palpable masses or tenderness, no rebound or guarding Extremities: no edema or skin discoloration or tenderness  Pelvic: Vulva: Normal             Vagina: No gross lesions or discharge  Cervix: No gross lesions or discharge  Uterus  AV, normal size, shape and consistency, non-tender and mobile  Adnexa  Without masses or tenderness  Anus: Normal  Assessment/Plan:  76 y.o. female for annual exam   1. Well female exam with routine gynecological exam Postmenopause, well on no HRT.  No PMB.  No pelvic pain.  Abstinent x 4 years. No h/o abnormal Pap.  Last Pap Neg in 2018.  No indication to repeat a Pap at this time. Urine and bowel movements normal.  Breasts normal.  Mammo Neg 10/2021.  Body mass index 29.52.  Bone Density 06/2017 Osteopenia Rt Femoral Neck T-Score -1.2.  Scheduled for a BD here next week.  Health labs with family physician.  Cologard Neg in 2021.  Followed by Ortho for Sciatic pain and knee issues.  2. Postmenopause Postmenopause, well on no HRT.  No PMB.  3. Osteopenia of neck of right  femur Bone Density 06/2017 Osteopenia Rt Femoral Neck T-Score -1.2.  Scheduled for a BD here next week.    Other orders - MOUNJARO 5 MG/0.5ML Pen; Inject into the skin.   Princess Bruins MD, 9:53 AM 11/23/2021

## 2021-11-27 DIAGNOSIS — J3089 Other allergic rhinitis: Secondary | ICD-10-CM | POA: Diagnosis not present

## 2021-11-27 DIAGNOSIS — J301 Allergic rhinitis due to pollen: Secondary | ICD-10-CM | POA: Diagnosis not present

## 2021-12-01 ENCOUNTER — Other Ambulatory Visit: Payer: Self-pay | Admitting: Obstetrics & Gynecology

## 2021-12-01 ENCOUNTER — Ambulatory Visit (INDEPENDENT_AMBULATORY_CARE_PROVIDER_SITE_OTHER): Payer: PPO

## 2021-12-01 DIAGNOSIS — Z780 Asymptomatic menopausal state: Secondary | ICD-10-CM | POA: Diagnosis not present

## 2021-12-01 DIAGNOSIS — M85852 Other specified disorders of bone density and structure, left thigh: Secondary | ICD-10-CM

## 2021-12-01 DIAGNOSIS — Z13820 Encounter for screening for osteoporosis: Secondary | ICD-10-CM

## 2021-12-01 DIAGNOSIS — M8580 Other specified disorders of bone density and structure, unspecified site: Secondary | ICD-10-CM

## 2021-12-04 DIAGNOSIS — J3089 Other allergic rhinitis: Secondary | ICD-10-CM | POA: Diagnosis not present

## 2021-12-04 DIAGNOSIS — J301 Allergic rhinitis due to pollen: Secondary | ICD-10-CM | POA: Diagnosis not present

## 2021-12-04 DIAGNOSIS — J3081 Allergic rhinitis due to animal (cat) (dog) hair and dander: Secondary | ICD-10-CM | POA: Diagnosis not present

## 2021-12-11 DIAGNOSIS — J3089 Other allergic rhinitis: Secondary | ICD-10-CM | POA: Diagnosis not present

## 2021-12-11 DIAGNOSIS — J3081 Allergic rhinitis due to animal (cat) (dog) hair and dander: Secondary | ICD-10-CM | POA: Diagnosis not present

## 2021-12-11 DIAGNOSIS — J301 Allergic rhinitis due to pollen: Secondary | ICD-10-CM | POA: Diagnosis not present

## 2021-12-18 DIAGNOSIS — J3081 Allergic rhinitis due to animal (cat) (dog) hair and dander: Secondary | ICD-10-CM | POA: Diagnosis not present

## 2021-12-18 DIAGNOSIS — J3089 Other allergic rhinitis: Secondary | ICD-10-CM | POA: Diagnosis not present

## 2021-12-18 DIAGNOSIS — J301 Allergic rhinitis due to pollen: Secondary | ICD-10-CM | POA: Diagnosis not present

## 2021-12-24 DIAGNOSIS — J3089 Other allergic rhinitis: Secondary | ICD-10-CM | POA: Diagnosis not present

## 2021-12-24 DIAGNOSIS — J3081 Allergic rhinitis due to animal (cat) (dog) hair and dander: Secondary | ICD-10-CM | POA: Diagnosis not present

## 2021-12-24 DIAGNOSIS — J301 Allergic rhinitis due to pollen: Secondary | ICD-10-CM | POA: Diagnosis not present

## 2022-01-01 DIAGNOSIS — J3089 Other allergic rhinitis: Secondary | ICD-10-CM | POA: Diagnosis not present

## 2022-01-01 DIAGNOSIS — J301 Allergic rhinitis due to pollen: Secondary | ICD-10-CM | POA: Diagnosis not present

## 2022-01-07 DIAGNOSIS — J3081 Allergic rhinitis due to animal (cat) (dog) hair and dander: Secondary | ICD-10-CM | POA: Diagnosis not present

## 2022-01-07 DIAGNOSIS — J3089 Other allergic rhinitis: Secondary | ICD-10-CM | POA: Diagnosis not present

## 2022-01-07 DIAGNOSIS — J301 Allergic rhinitis due to pollen: Secondary | ICD-10-CM | POA: Diagnosis not present

## 2022-01-15 DIAGNOSIS — J3089 Other allergic rhinitis: Secondary | ICD-10-CM | POA: Diagnosis not present

## 2022-01-15 DIAGNOSIS — J301 Allergic rhinitis due to pollen: Secondary | ICD-10-CM | POA: Diagnosis not present

## 2022-01-22 DIAGNOSIS — J3089 Other allergic rhinitis: Secondary | ICD-10-CM | POA: Diagnosis not present

## 2022-01-22 DIAGNOSIS — J301 Allergic rhinitis due to pollen: Secondary | ICD-10-CM | POA: Diagnosis not present

## 2022-01-29 DIAGNOSIS — J301 Allergic rhinitis due to pollen: Secondary | ICD-10-CM | POA: Diagnosis not present

## 2022-01-29 DIAGNOSIS — J3089 Other allergic rhinitis: Secondary | ICD-10-CM | POA: Diagnosis not present

## 2022-01-29 DIAGNOSIS — J3081 Allergic rhinitis due to animal (cat) (dog) hair and dander: Secondary | ICD-10-CM | POA: Diagnosis not present

## 2022-02-03 DIAGNOSIS — E119 Type 2 diabetes mellitus without complications: Secondary | ICD-10-CM | POA: Diagnosis not present

## 2022-02-05 DIAGNOSIS — J3089 Other allergic rhinitis: Secondary | ICD-10-CM | POA: Diagnosis not present

## 2022-02-05 DIAGNOSIS — J301 Allergic rhinitis due to pollen: Secondary | ICD-10-CM | POA: Diagnosis not present

## 2022-02-05 DIAGNOSIS — J3081 Allergic rhinitis due to animal (cat) (dog) hair and dander: Secondary | ICD-10-CM | POA: Diagnosis not present

## 2022-02-12 DIAGNOSIS — J3081 Allergic rhinitis due to animal (cat) (dog) hair and dander: Secondary | ICD-10-CM | POA: Diagnosis not present

## 2022-02-12 DIAGNOSIS — J301 Allergic rhinitis due to pollen: Secondary | ICD-10-CM | POA: Diagnosis not present

## 2022-02-12 DIAGNOSIS — J3089 Other allergic rhinitis: Secondary | ICD-10-CM | POA: Diagnosis not present

## 2022-02-19 DIAGNOSIS — J3081 Allergic rhinitis due to animal (cat) (dog) hair and dander: Secondary | ICD-10-CM | POA: Diagnosis not present

## 2022-02-19 DIAGNOSIS — J3089 Other allergic rhinitis: Secondary | ICD-10-CM | POA: Diagnosis not present

## 2022-02-19 DIAGNOSIS — J301 Allergic rhinitis due to pollen: Secondary | ICD-10-CM | POA: Diagnosis not present

## 2022-02-23 DIAGNOSIS — E782 Mixed hyperlipidemia: Secondary | ICD-10-CM | POA: Diagnosis not present

## 2022-02-23 DIAGNOSIS — E1169 Type 2 diabetes mellitus with other specified complication: Secondary | ICD-10-CM | POA: Diagnosis not present

## 2022-02-26 DIAGNOSIS — J3081 Allergic rhinitis due to animal (cat) (dog) hair and dander: Secondary | ICD-10-CM | POA: Diagnosis not present

## 2022-02-26 DIAGNOSIS — J3089 Other allergic rhinitis: Secondary | ICD-10-CM | POA: Diagnosis not present

## 2022-02-26 DIAGNOSIS — J301 Allergic rhinitis due to pollen: Secondary | ICD-10-CM | POA: Diagnosis not present

## 2022-03-01 DIAGNOSIS — J309 Allergic rhinitis, unspecified: Secondary | ICD-10-CM | POA: Diagnosis not present

## 2022-03-01 DIAGNOSIS — D509 Iron deficiency anemia, unspecified: Secondary | ICD-10-CM | POA: Diagnosis not present

## 2022-03-01 DIAGNOSIS — E875 Hyperkalemia: Secondary | ICD-10-CM | POA: Diagnosis not present

## 2022-03-01 DIAGNOSIS — I10 Essential (primary) hypertension: Secondary | ICD-10-CM | POA: Diagnosis not present

## 2022-03-01 DIAGNOSIS — E669 Obesity, unspecified: Secondary | ICD-10-CM | POA: Diagnosis not present

## 2022-03-01 DIAGNOSIS — I209 Angina pectoris, unspecified: Secondary | ICD-10-CM | POA: Diagnosis not present

## 2022-03-01 DIAGNOSIS — F329 Major depressive disorder, single episode, unspecified: Secondary | ICD-10-CM | POA: Diagnosis not present

## 2022-03-01 DIAGNOSIS — M25561 Pain in right knee: Secondary | ICD-10-CM | POA: Diagnosis not present

## 2022-03-01 DIAGNOSIS — M8580 Other specified disorders of bone density and structure, unspecified site: Secondary | ICD-10-CM | POA: Diagnosis not present

## 2022-03-01 DIAGNOSIS — E1169 Type 2 diabetes mellitus with other specified complication: Secondary | ICD-10-CM | POA: Diagnosis not present

## 2022-03-01 DIAGNOSIS — E1140 Type 2 diabetes mellitus with diabetic neuropathy, unspecified: Secondary | ICD-10-CM | POA: Diagnosis not present

## 2022-03-01 DIAGNOSIS — E782 Mixed hyperlipidemia: Secondary | ICD-10-CM | POA: Diagnosis not present

## 2022-03-05 DIAGNOSIS — J3081 Allergic rhinitis due to animal (cat) (dog) hair and dander: Secondary | ICD-10-CM | POA: Diagnosis not present

## 2022-03-05 DIAGNOSIS — J301 Allergic rhinitis due to pollen: Secondary | ICD-10-CM | POA: Diagnosis not present

## 2022-03-05 DIAGNOSIS — J3089 Other allergic rhinitis: Secondary | ICD-10-CM | POA: Diagnosis not present

## 2022-03-15 DIAGNOSIS — J301 Allergic rhinitis due to pollen: Secondary | ICD-10-CM | POA: Diagnosis not present

## 2022-03-15 DIAGNOSIS — J3089 Other allergic rhinitis: Secondary | ICD-10-CM | POA: Diagnosis not present

## 2022-03-15 DIAGNOSIS — J3081 Allergic rhinitis due to animal (cat) (dog) hair and dander: Secondary | ICD-10-CM | POA: Diagnosis not present

## 2022-03-22 DIAGNOSIS — J3081 Allergic rhinitis due to animal (cat) (dog) hair and dander: Secondary | ICD-10-CM | POA: Diagnosis not present

## 2022-03-22 DIAGNOSIS — J301 Allergic rhinitis due to pollen: Secondary | ICD-10-CM | POA: Diagnosis not present

## 2022-03-22 DIAGNOSIS — J3089 Other allergic rhinitis: Secondary | ICD-10-CM | POA: Diagnosis not present

## 2022-03-30 DIAGNOSIS — J301 Allergic rhinitis due to pollen: Secondary | ICD-10-CM | POA: Diagnosis not present

## 2022-03-30 DIAGNOSIS — J3081 Allergic rhinitis due to animal (cat) (dog) hair and dander: Secondary | ICD-10-CM | POA: Diagnosis not present

## 2022-03-30 DIAGNOSIS — J3089 Other allergic rhinitis: Secondary | ICD-10-CM | POA: Diagnosis not present

## 2022-04-06 DIAGNOSIS — J301 Allergic rhinitis due to pollen: Secondary | ICD-10-CM | POA: Diagnosis not present

## 2022-04-06 DIAGNOSIS — J3089 Other allergic rhinitis: Secondary | ICD-10-CM | POA: Diagnosis not present

## 2022-04-13 DIAGNOSIS — J3089 Other allergic rhinitis: Secondary | ICD-10-CM | POA: Diagnosis not present

## 2022-04-13 DIAGNOSIS — J301 Allergic rhinitis due to pollen: Secondary | ICD-10-CM | POA: Diagnosis not present

## 2022-04-13 DIAGNOSIS — J3081 Allergic rhinitis due to animal (cat) (dog) hair and dander: Secondary | ICD-10-CM | POA: Diagnosis not present

## 2022-04-20 DIAGNOSIS — J301 Allergic rhinitis due to pollen: Secondary | ICD-10-CM | POA: Diagnosis not present

## 2022-04-20 DIAGNOSIS — J3081 Allergic rhinitis due to animal (cat) (dog) hair and dander: Secondary | ICD-10-CM | POA: Diagnosis not present

## 2022-04-20 DIAGNOSIS — J3089 Other allergic rhinitis: Secondary | ICD-10-CM | POA: Diagnosis not present

## 2022-04-28 DIAGNOSIS — J3081 Allergic rhinitis due to animal (cat) (dog) hair and dander: Secondary | ICD-10-CM | POA: Diagnosis not present

## 2022-04-28 DIAGNOSIS — J301 Allergic rhinitis due to pollen: Secondary | ICD-10-CM | POA: Diagnosis not present

## 2022-04-28 DIAGNOSIS — J3089 Other allergic rhinitis: Secondary | ICD-10-CM | POA: Diagnosis not present

## 2022-05-05 DIAGNOSIS — J301 Allergic rhinitis due to pollen: Secondary | ICD-10-CM | POA: Diagnosis not present

## 2022-05-05 DIAGNOSIS — J3089 Other allergic rhinitis: Secondary | ICD-10-CM | POA: Diagnosis not present

## 2022-05-12 DIAGNOSIS — J3089 Other allergic rhinitis: Secondary | ICD-10-CM | POA: Diagnosis not present

## 2022-05-12 DIAGNOSIS — J3081 Allergic rhinitis due to animal (cat) (dog) hair and dander: Secondary | ICD-10-CM | POA: Diagnosis not present

## 2022-05-12 DIAGNOSIS — J301 Allergic rhinitis due to pollen: Secondary | ICD-10-CM | POA: Diagnosis not present

## 2022-05-19 DIAGNOSIS — J3081 Allergic rhinitis due to animal (cat) (dog) hair and dander: Secondary | ICD-10-CM | POA: Diagnosis not present

## 2022-05-19 DIAGNOSIS — J301 Allergic rhinitis due to pollen: Secondary | ICD-10-CM | POA: Diagnosis not present

## 2022-05-19 DIAGNOSIS — J3089 Other allergic rhinitis: Secondary | ICD-10-CM | POA: Diagnosis not present

## 2022-05-26 DIAGNOSIS — J3089 Other allergic rhinitis: Secondary | ICD-10-CM | POA: Diagnosis not present

## 2022-05-26 DIAGNOSIS — J301 Allergic rhinitis due to pollen: Secondary | ICD-10-CM | POA: Diagnosis not present

## 2022-06-02 DIAGNOSIS — J301 Allergic rhinitis due to pollen: Secondary | ICD-10-CM | POA: Diagnosis not present

## 2022-06-02 DIAGNOSIS — J3089 Other allergic rhinitis: Secondary | ICD-10-CM | POA: Diagnosis not present

## 2022-06-02 DIAGNOSIS — J3081 Allergic rhinitis due to animal (cat) (dog) hair and dander: Secondary | ICD-10-CM | POA: Diagnosis not present

## 2022-06-08 DIAGNOSIS — E1169 Type 2 diabetes mellitus with other specified complication: Secondary | ICD-10-CM | POA: Diagnosis not present

## 2022-06-08 DIAGNOSIS — E782 Mixed hyperlipidemia: Secondary | ICD-10-CM | POA: Diagnosis not present

## 2022-06-09 DIAGNOSIS — J3081 Allergic rhinitis due to animal (cat) (dog) hair and dander: Secondary | ICD-10-CM | POA: Diagnosis not present

## 2022-06-09 DIAGNOSIS — J3089 Other allergic rhinitis: Secondary | ICD-10-CM | POA: Diagnosis not present

## 2022-06-09 DIAGNOSIS — J301 Allergic rhinitis due to pollen: Secondary | ICD-10-CM | POA: Diagnosis not present

## 2022-06-15 ENCOUNTER — Other Ambulatory Visit (HOSPITAL_COMMUNITY): Payer: Self-pay | Admitting: Internal Medicine

## 2022-06-15 DIAGNOSIS — D509 Iron deficiency anemia, unspecified: Secondary | ICD-10-CM | POA: Diagnosis not present

## 2022-06-15 DIAGNOSIS — E875 Hyperkalemia: Secondary | ICD-10-CM | POA: Diagnosis not present

## 2022-06-15 DIAGNOSIS — I10 Essential (primary) hypertension: Secondary | ICD-10-CM | POA: Diagnosis not present

## 2022-06-15 DIAGNOSIS — J309 Allergic rhinitis, unspecified: Secondary | ICD-10-CM | POA: Diagnosis not present

## 2022-06-15 DIAGNOSIS — R911 Solitary pulmonary nodule: Secondary | ICD-10-CM

## 2022-06-15 DIAGNOSIS — F329 Major depressive disorder, single episode, unspecified: Secondary | ICD-10-CM | POA: Diagnosis not present

## 2022-06-15 DIAGNOSIS — I209 Angina pectoris, unspecified: Secondary | ICD-10-CM | POA: Diagnosis not present

## 2022-06-15 DIAGNOSIS — E1169 Type 2 diabetes mellitus with other specified complication: Secondary | ICD-10-CM | POA: Diagnosis not present

## 2022-06-15 DIAGNOSIS — E7800 Pure hypercholesterolemia, unspecified: Secondary | ICD-10-CM | POA: Diagnosis not present

## 2022-06-15 DIAGNOSIS — E1142 Type 2 diabetes mellitus with diabetic polyneuropathy: Secondary | ICD-10-CM | POA: Diagnosis not present

## 2022-06-15 DIAGNOSIS — M8580 Other specified disorders of bone density and structure, unspecified site: Secondary | ICD-10-CM | POA: Diagnosis not present

## 2022-06-15 DIAGNOSIS — I700 Atherosclerosis of aorta: Secondary | ICD-10-CM | POA: Diagnosis not present

## 2022-06-15 DIAGNOSIS — E782 Mixed hyperlipidemia: Secondary | ICD-10-CM | POA: Diagnosis not present

## 2022-06-16 DIAGNOSIS — J3089 Other allergic rhinitis: Secondary | ICD-10-CM | POA: Diagnosis not present

## 2022-06-16 DIAGNOSIS — J301 Allergic rhinitis due to pollen: Secondary | ICD-10-CM | POA: Diagnosis not present

## 2022-06-23 DIAGNOSIS — J3081 Allergic rhinitis due to animal (cat) (dog) hair and dander: Secondary | ICD-10-CM | POA: Diagnosis not present

## 2022-06-23 DIAGNOSIS — J301 Allergic rhinitis due to pollen: Secondary | ICD-10-CM | POA: Diagnosis not present

## 2022-06-23 DIAGNOSIS — J3089 Other allergic rhinitis: Secondary | ICD-10-CM | POA: Diagnosis not present

## 2022-06-23 DIAGNOSIS — Z1211 Encounter for screening for malignant neoplasm of colon: Secondary | ICD-10-CM | POA: Diagnosis not present

## 2022-06-30 DIAGNOSIS — J3089 Other allergic rhinitis: Secondary | ICD-10-CM | POA: Diagnosis not present

## 2022-06-30 DIAGNOSIS — J3081 Allergic rhinitis due to animal (cat) (dog) hair and dander: Secondary | ICD-10-CM | POA: Diagnosis not present

## 2022-06-30 DIAGNOSIS — J301 Allergic rhinitis due to pollen: Secondary | ICD-10-CM | POA: Diagnosis not present

## 2022-07-01 LAB — COLOGUARD: COLOGUARD: NEGATIVE

## 2022-07-05 ENCOUNTER — Ambulatory Visit (HOSPITAL_COMMUNITY)
Admission: RE | Admit: 2022-07-05 | Discharge: 2022-07-05 | Disposition: A | Discharge status: Home / Self Care | Payer: PPO | Source: Ambulatory Visit | Attending: Internal Medicine | Admitting: Internal Medicine

## 2022-07-05 DIAGNOSIS — R911 Solitary pulmonary nodule: Secondary | ICD-10-CM

## 2022-07-07 DIAGNOSIS — J3089 Other allergic rhinitis: Secondary | ICD-10-CM | POA: Diagnosis not present

## 2022-07-07 DIAGNOSIS — J301 Allergic rhinitis due to pollen: Secondary | ICD-10-CM | POA: Diagnosis not present

## 2022-07-14 DIAGNOSIS — J3089 Other allergic rhinitis: Secondary | ICD-10-CM | POA: Diagnosis not present

## 2022-07-14 DIAGNOSIS — J3081 Allergic rhinitis due to animal (cat) (dog) hair and dander: Secondary | ICD-10-CM | POA: Diagnosis not present

## 2022-07-14 DIAGNOSIS — J301 Allergic rhinitis due to pollen: Secondary | ICD-10-CM | POA: Diagnosis not present

## 2022-07-15 DIAGNOSIS — J301 Allergic rhinitis due to pollen: Secondary | ICD-10-CM | POA: Diagnosis not present

## 2022-07-15 DIAGNOSIS — J3089 Other allergic rhinitis: Secondary | ICD-10-CM | POA: Diagnosis not present

## 2022-07-19 ENCOUNTER — Other Ambulatory Visit (HOSPITAL_COMMUNITY): Payer: Self-pay | Admitting: Internal Medicine

## 2022-07-19 DIAGNOSIS — E1169 Type 2 diabetes mellitus with other specified complication: Secondary | ICD-10-CM | POA: Diagnosis not present

## 2022-07-19 DIAGNOSIS — Z7182 Exercise counseling: Secondary | ICD-10-CM | POA: Diagnosis not present

## 2022-07-19 DIAGNOSIS — Z713 Dietary counseling and surveillance: Secondary | ICD-10-CM | POA: Diagnosis not present

## 2022-07-19 DIAGNOSIS — R5383 Other fatigue: Secondary | ICD-10-CM | POA: Diagnosis not present

## 2022-07-19 DIAGNOSIS — I10 Essential (primary) hypertension: Secondary | ICD-10-CM | POA: Diagnosis not present

## 2022-07-19 DIAGNOSIS — Z7984 Long term (current) use of oral hypoglycemic drugs: Secondary | ICD-10-CM | POA: Diagnosis not present

## 2022-07-19 DIAGNOSIS — E785 Hyperlipidemia, unspecified: Secondary | ICD-10-CM | POA: Diagnosis not present

## 2022-07-19 DIAGNOSIS — R911 Solitary pulmonary nodule: Secondary | ICD-10-CM | POA: Diagnosis not present

## 2022-07-19 DIAGNOSIS — E041 Nontoxic single thyroid nodule: Secondary | ICD-10-CM | POA: Diagnosis not present

## 2022-07-19 DIAGNOSIS — Z79899 Other long term (current) drug therapy: Secondary | ICD-10-CM | POA: Diagnosis not present

## 2022-07-19 DIAGNOSIS — Z6823 Body mass index (BMI) 23.0-23.9, adult: Secondary | ICD-10-CM | POA: Diagnosis not present

## 2022-07-19 DIAGNOSIS — Z8639 Personal history of other endocrine, nutritional and metabolic disease: Secondary | ICD-10-CM | POA: Diagnosis not present

## 2022-07-21 ENCOUNTER — Ambulatory Visit (HOSPITAL_COMMUNITY)
Admission: RE | Admit: 2022-07-21 | Discharge: 2022-07-21 | Disposition: A | Discharge status: Home / Self Care | Payer: PPO | Source: Ambulatory Visit | Attending: Internal Medicine | Admitting: Internal Medicine

## 2022-07-21 DIAGNOSIS — E041 Nontoxic single thyroid nodule: Secondary | ICD-10-CM | POA: Diagnosis not present

## 2022-07-21 DIAGNOSIS — J3081 Allergic rhinitis due to animal (cat) (dog) hair and dander: Secondary | ICD-10-CM | POA: Diagnosis not present

## 2022-07-21 DIAGNOSIS — J3089 Other allergic rhinitis: Secondary | ICD-10-CM | POA: Diagnosis not present

## 2022-07-21 DIAGNOSIS — J301 Allergic rhinitis due to pollen: Secondary | ICD-10-CM | POA: Diagnosis not present

## 2022-07-28 DIAGNOSIS — J3081 Allergic rhinitis due to animal (cat) (dog) hair and dander: Secondary | ICD-10-CM | POA: Diagnosis not present

## 2022-07-28 DIAGNOSIS — J301 Allergic rhinitis due to pollen: Secondary | ICD-10-CM | POA: Diagnosis not present

## 2022-07-28 DIAGNOSIS — J3089 Other allergic rhinitis: Secondary | ICD-10-CM | POA: Diagnosis not present

## 2022-08-04 DIAGNOSIS — J3089 Other allergic rhinitis: Secondary | ICD-10-CM | POA: Diagnosis not present

## 2022-08-04 DIAGNOSIS — J301 Allergic rhinitis due to pollen: Secondary | ICD-10-CM | POA: Diagnosis not present

## 2022-08-12 DIAGNOSIS — J301 Allergic rhinitis due to pollen: Secondary | ICD-10-CM | POA: Diagnosis not present

## 2022-08-12 DIAGNOSIS — J3089 Other allergic rhinitis: Secondary | ICD-10-CM | POA: Diagnosis not present

## 2022-08-18 DIAGNOSIS — J3081 Allergic rhinitis due to animal (cat) (dog) hair and dander: Secondary | ICD-10-CM | POA: Diagnosis not present

## 2022-08-18 DIAGNOSIS — J301 Allergic rhinitis due to pollen: Secondary | ICD-10-CM | POA: Diagnosis not present

## 2022-08-18 DIAGNOSIS — J3089 Other allergic rhinitis: Secondary | ICD-10-CM | POA: Diagnosis not present

## 2022-08-25 DIAGNOSIS — J3089 Other allergic rhinitis: Secondary | ICD-10-CM | POA: Diagnosis not present

## 2022-08-25 DIAGNOSIS — J301 Allergic rhinitis due to pollen: Secondary | ICD-10-CM | POA: Diagnosis not present

## 2022-09-02 DIAGNOSIS — J301 Allergic rhinitis due to pollen: Secondary | ICD-10-CM | POA: Diagnosis not present

## 2022-09-02 DIAGNOSIS — J3089 Other allergic rhinitis: Secondary | ICD-10-CM | POA: Diagnosis not present

## 2022-09-09 DIAGNOSIS — J301 Allergic rhinitis due to pollen: Secondary | ICD-10-CM | POA: Diagnosis not present

## 2022-09-09 DIAGNOSIS — J3089 Other allergic rhinitis: Secondary | ICD-10-CM | POA: Diagnosis not present

## 2022-09-15 DIAGNOSIS — J3089 Other allergic rhinitis: Secondary | ICD-10-CM | POA: Diagnosis not present

## 2022-09-15 DIAGNOSIS — H1045 Other chronic allergic conjunctivitis: Secondary | ICD-10-CM | POA: Diagnosis not present

## 2022-09-15 DIAGNOSIS — J301 Allergic rhinitis due to pollen: Secondary | ICD-10-CM | POA: Diagnosis not present

## 2022-09-15 DIAGNOSIS — J3081 Allergic rhinitis due to animal (cat) (dog) hair and dander: Secondary | ICD-10-CM | POA: Diagnosis not present

## 2022-09-22 DIAGNOSIS — J3089 Other allergic rhinitis: Secondary | ICD-10-CM | POA: Diagnosis not present

## 2022-09-22 DIAGNOSIS — J301 Allergic rhinitis due to pollen: Secondary | ICD-10-CM | POA: Diagnosis not present

## 2022-09-22 DIAGNOSIS — J3081 Allergic rhinitis due to animal (cat) (dog) hair and dander: Secondary | ICD-10-CM | POA: Diagnosis not present

## 2022-09-29 DIAGNOSIS — J301 Allergic rhinitis due to pollen: Secondary | ICD-10-CM | POA: Diagnosis not present

## 2022-09-29 DIAGNOSIS — J3089 Other allergic rhinitis: Secondary | ICD-10-CM | POA: Diagnosis not present

## 2022-09-29 DIAGNOSIS — J3081 Allergic rhinitis due to animal (cat) (dog) hair and dander: Secondary | ICD-10-CM | POA: Diagnosis not present

## 2022-10-05 DIAGNOSIS — E1169 Type 2 diabetes mellitus with other specified complication: Secondary | ICD-10-CM | POA: Diagnosis not present

## 2022-10-05 DIAGNOSIS — E782 Mixed hyperlipidemia: Secondary | ICD-10-CM | POA: Diagnosis not present

## 2022-10-06 DIAGNOSIS — J3089 Other allergic rhinitis: Secondary | ICD-10-CM | POA: Diagnosis not present

## 2022-10-06 DIAGNOSIS — J3081 Allergic rhinitis due to animal (cat) (dog) hair and dander: Secondary | ICD-10-CM | POA: Diagnosis not present

## 2022-10-06 DIAGNOSIS — J301 Allergic rhinitis due to pollen: Secondary | ICD-10-CM | POA: Diagnosis not present

## 2022-10-11 DIAGNOSIS — F329 Major depressive disorder, single episode, unspecified: Secondary | ICD-10-CM | POA: Diagnosis not present

## 2022-10-11 DIAGNOSIS — D509 Iron deficiency anemia, unspecified: Secondary | ICD-10-CM | POA: Diagnosis not present

## 2022-10-11 DIAGNOSIS — E875 Hyperkalemia: Secondary | ICD-10-CM | POA: Diagnosis not present

## 2022-10-11 DIAGNOSIS — J309 Allergic rhinitis, unspecified: Secondary | ICD-10-CM | POA: Diagnosis not present

## 2022-10-11 DIAGNOSIS — E1142 Type 2 diabetes mellitus with diabetic polyneuropathy: Secondary | ICD-10-CM | POA: Diagnosis not present

## 2022-10-11 DIAGNOSIS — I10 Essential (primary) hypertension: Secondary | ICD-10-CM | POA: Diagnosis not present

## 2022-10-11 DIAGNOSIS — E1169 Type 2 diabetes mellitus with other specified complication: Secondary | ICD-10-CM | POA: Diagnosis not present

## 2022-10-11 DIAGNOSIS — R911 Solitary pulmonary nodule: Secondary | ICD-10-CM | POA: Diagnosis not present

## 2022-10-11 DIAGNOSIS — E782 Mixed hyperlipidemia: Secondary | ICD-10-CM | POA: Diagnosis not present

## 2022-10-11 DIAGNOSIS — M8580 Other specified disorders of bone density and structure, unspecified site: Secondary | ICD-10-CM | POA: Diagnosis not present

## 2022-10-11 DIAGNOSIS — E7800 Pure hypercholesterolemia, unspecified: Secondary | ICD-10-CM | POA: Diagnosis not present

## 2022-10-11 DIAGNOSIS — I700 Atherosclerosis of aorta: Secondary | ICD-10-CM | POA: Diagnosis not present

## 2022-10-13 DIAGNOSIS — J3081 Allergic rhinitis due to animal (cat) (dog) hair and dander: Secondary | ICD-10-CM | POA: Diagnosis not present

## 2022-10-13 DIAGNOSIS — J3089 Other allergic rhinitis: Secondary | ICD-10-CM | POA: Diagnosis not present

## 2022-10-13 DIAGNOSIS — J301 Allergic rhinitis due to pollen: Secondary | ICD-10-CM | POA: Diagnosis not present

## 2022-10-20 DIAGNOSIS — J3081 Allergic rhinitis due to animal (cat) (dog) hair and dander: Secondary | ICD-10-CM | POA: Diagnosis not present

## 2022-10-20 DIAGNOSIS — J3089 Other allergic rhinitis: Secondary | ICD-10-CM | POA: Diagnosis not present

## 2022-10-20 DIAGNOSIS — J301 Allergic rhinitis due to pollen: Secondary | ICD-10-CM | POA: Diagnosis not present

## 2022-10-27 ENCOUNTER — Encounter: Payer: Self-pay | Admitting: Cardiovascular Disease

## 2022-10-27 ENCOUNTER — Ambulatory Visit: Payer: PPO | Attending: Cardiovascular Disease | Admitting: Cardiovascular Disease

## 2022-10-27 VITALS — BP 102/62 | HR 81 | Ht 63.0 in | Wt 131.8 lb

## 2022-10-27 DIAGNOSIS — Z794 Long term (current) use of insulin: Secondary | ICD-10-CM | POA: Diagnosis not present

## 2022-10-27 DIAGNOSIS — J3081 Allergic rhinitis due to animal (cat) (dog) hair and dander: Secondary | ICD-10-CM | POA: Diagnosis not present

## 2022-10-27 DIAGNOSIS — E119 Type 2 diabetes mellitus without complications: Secondary | ICD-10-CM | POA: Diagnosis not present

## 2022-10-27 DIAGNOSIS — E041 Nontoxic single thyroid nodule: Secondary | ICD-10-CM | POA: Diagnosis not present

## 2022-10-27 DIAGNOSIS — E782 Mixed hyperlipidemia: Secondary | ICD-10-CM | POA: Diagnosis not present

## 2022-10-27 DIAGNOSIS — R911 Solitary pulmonary nodule: Secondary | ICD-10-CM | POA: Diagnosis not present

## 2022-10-27 DIAGNOSIS — R931 Abnormal findings on diagnostic imaging of heart and coronary circulation: Secondary | ICD-10-CM | POA: Diagnosis not present

## 2022-10-27 DIAGNOSIS — J3089 Other allergic rhinitis: Secondary | ICD-10-CM | POA: Diagnosis not present

## 2022-10-27 DIAGNOSIS — I471 Supraventricular tachycardia, unspecified: Secondary | ICD-10-CM | POA: Diagnosis not present

## 2022-10-27 DIAGNOSIS — J301 Allergic rhinitis due to pollen: Secondary | ICD-10-CM | POA: Diagnosis not present

## 2022-10-27 NOTE — Patient Instructions (Signed)
Medication Instructions:  No changes *If you need a refill on your cardiac medications before your next appointment, please call your pharmacy*  Testing/Procedures: CT-scan of the chest without contrast needed in mid September 2024 to follow up on Left lower lobe nodule  Follow-Up: At [facility], you and your health needs are our priority.  As part of our continuing mission to provide you with exceptional heart care, we have created designated Provider Care Teams.  These Care Teams include your primary Cardiologist (physician) and Advanced Practice Providers (APPs -  Physician Assistants and Nurse Practitioners) who all work together to provide you with the care you need, when you need it.  We recommend signing up for the patient portal called "MyChart".  Sign up information is provided on this After Visit Summary.  MyChart is used to connect with patients for Virtual Visits (Telemedicine).  Patients are able to view lab/test results, encounter notes, upcoming appointments, etc.  Non-urgent messages can be sent to your provider as well.   To learn more about what you can do with MyChart, go to [email or web address].    Your next appointment:   1 year(s)  Provider:   Thurmon Fair, MD

## 2022-10-27 NOTE — Progress Notes (Signed)
Cardiology Office Note    Date:  10/27/2022   ID:  Stellarose, Cerny [birth year], MRN [id number]  PCP:  Benita Stabile, MD  Cardiologist:   Thurmon Fair, MD   Chief Complaint  Patient presents with   elevated coronary calcium score    History of Present Illness:  Brenda Hatfield is a 77 y.o. female with a history of type 2 diabetes mellitus, dyslipidemia, obesity, supraventricular tachycardia and neurally mediated syncope, who had an evaluation about a year ago due to complaints of chest pain.  This showed that she has a elevated coronary calcium score at roughly 75th percentile, but without significant coronary obstructive disease.  Since her last appointment there have been significant changes in her health.  She started treatment with [facility] and has lost about 48 pounds.  She has stopped treatment with [place].  She has a hemoglobin A1c that is now down to 6.0%.  She feels great.  She has not had any more chest pain.  Her blood pressure is relatively low at 102/62 but she has not had any problems with dizziness or lightheadedness or syncope.  She also denies dyspnea at rest with activity, palpitations, focal neurologic complaints or claudication.  She has not had any lower extremity edema, orthopnea or PND.  She does not have hypertension, but takes losartan for renal function protection from diabetic nephropathy.  Similarly, she takes metoprolol for history of SVT, not for hypertension.    She has not had any recent episodes of syncope or presyncope, but has had previous event strongly suggestive of vasovagal events (during a pedicure for example).  In fact she has had a lifelong history of episodes of presyncope that she can abort by sitting down.  Her most recent metabolic profile looks great with a hemoglobin A1c of 6.0%, LDL 49, HDL 52, normal triglycerides.  She has normal renal function with a creatinine 0.72.  She does not smoke.  Her coronary CT in February 2023 showed a 6  mm left lower lobe pulmonary nodule.  She underwent a follow-up CT of the chest without contrast in March 2024 showing a "part solid nodule of the left lower lobe" solid component unchanged in size of 6 mm with surrounding groundglass infiltrates.  Repeat study at 6 months was again recommended.  Also incidentally she had a 1.6 cm left thyroid nodule.  TSH was normal.   A nuclear stress test in 2012 showed breast attenuation artifact, but was otherwise normal. An echocardiogram in 2001 was a mostly normal study: Systolic function, EF>55%, very mild left ventricular hypertrophy. There was mild mitral annular calcification and trivial MR, aortic valve sclerosis without stenosis which likely explains cardiac murmur. The left atrium was mildly enlarged at 4.5 cm.  Coronary CT angio performed 06/02/2021 showed an elevated calcium score of 229 (74th percentile) without coronary obstruction.   Past Medical History:  Diagnosis Date   Chest pain 09/16/2010   normal myocardial perfusion study EF=61%   Diabetes mellitus (HCC)    type 2   GERD (gastroesophageal reflux disease)    History of cardiac murmur 02/23/00   2D Echo EF greater than 55%   Hyperlipemia    Obesity    Osteopenia 06/2017   T score -1.2 FRAX 9% / 1.1%   Palpitation    Seasonal allergies    SVT (supraventricular tachycardia)    Systemic hypertension     Past Surgical History:  Procedure Laterality Date   CATARACT EXTRACTION Right 2003  [facility]   CATARACT EXTRACTION W/PHACO Left 04/09/2013   Procedure: LEFT EYE CATARACT EXTRACTION PHACO AND INTRAOCULAR LENS PLACEMENT ;  Surgeon: Gemma Payor, MD;  Location: [facility];  Service: Ophthalmology;  Laterality: Left;  CDE 10.78   MOUTH SURGERY  04/2017   spot removed by Dr. Jeanice Lim    TONSILLECTOMY  [phone]   TUBAL LIGATION  1980    Current Medications: Outpatient Medications Prior to Visit  Medication Sig Dispense Refill   aspirin 81 MG tablet Take 81 mg by mouth daily.      atorvastatin (LIPITOR) 80 MG tablet Take 80 mg by mouth daily.     azelastine (OPTIVAR) 0.05 % ophthalmic solution Apply 1 drop to eye 2 (two) times daily.     FLUoxetine (PROZAC) 20 MG capsule Take 20 mg by mouth every morning.     levocetirizine (XYZAL) 5 MG tablet Take 5 mg by mouth every evening.     losartan (COZAAR) 25 MG tablet Take 25 mg by mouth daily.     metFORMIN (GLUCOPHAGE) 1000 MG tablet Take 1,000 mg by mouth 2 (two) times daily with a meal.     metoprolol succinate (TOPROL-XL) 25 MG 24 hr tablet Take 0.5 tablets (12.5 mg total) by mouth daily. 45 tablet 3   MOUNJARO 5 MG/0.5ML Pen Inject into the skin once a week.     Multiple Vitamin (MULTIVITAMIN) capsule Take 1 capsule by mouth daily.     omeprazole (PRILOSEC) 20 MG capsule Take 20 mg by mouth every other day.      empagliflozin (JARDIANCE) 10 MG TABS tablet Take 10 mg by mouth daily. (Patient not taking: Reported on 10/27/2022)     EPINEPHrine 0.3 mg/0.3 mL IJ SOAJ injection See admin instructions. (Patient not taking: Reported on 10/27/2022)     glucose 4 GM chewable tablet Chew 0.5 tablets by mouth as needed for low blood sugar. (Patient not taking: Reported on 10/27/2022)     traMADol (ULTRAM) 50 MG tablet Take 1 tablet (50 mg total) by mouth every 6 (six) hours as needed. (Patient not taking: Reported on 10/27/2022) 15 tablet 0   No facility-administered medications prior to visit.     Allergies:   Patient has no known allergies.   Social History   Socioeconomic History   Marital status: Married    Spouse name: Not on file   Number of children: Not on file   Years of education: Not on file   Highest education level: Not on file  Occupational History   Not on file  Tobacco Use   Smoking status: Never   Smokeless tobacco: Never  Vaping Use   Vaping Use: Never used  Substance and Sexual Activity   Alcohol use: No   Drug use: No   Sexual activity: Yes    Partners: Male    Birth control/protection: None     Comment: First sexual intercouse at 77 yrs old. Less than partnere in a life time.   Other Topics Concern   Not on file  Social History Narrative   Not on file   Social Determinants of Health   Financial Resource Strain: Not on file  Food Insecurity: Not on file  Transportation Needs: Not on file  Physical Activity: Not on file  Stress: Not on file  Social Connections: Not on file     Family History:  The patient's family history includes Diabetes in her father; Parkinson's disease in her father.   ROS:   Please see the history  of present illness.    ROS All other systems reviewed and are negative.   PHYSICAL EXAM:   VS:  BP 102/62 (BP Location: Left Arm, Patient Position: Sitting, Cuff Size: Normal)   Pulse 81   Ht 5\' 3"  (1.6 m)   Wt 131 lb 12.8 oz (59.8 kg)   SpO2 90%   BMI 23.35 kg/m     General: Alert, oriented x3, no distress, appears lean and fit Head: no evidence of trauma, PERRL, EOMI, no exophtalmos or lid lag, no myxedema, no xanthelasma; normal ears, nose and oropharynx Neck: normal jugular venous pulsations and no hepatojugular reflux; brisk carotid pulses without delay and no carotid bruits Chest: clear to auscultation, no signs of consolidation by percussion or palpation, normal fremitus, symmetrical and full respiratory excursions Cardiovascular: normal position and quality of the apical impulse, regular rhythm, normal first and second heart sounds, no murmurs, rubs or gallops Abdomen: no tenderness or distention, no masses by palpation, no abnormal pulsatility or arterial bruits, normal bowel sounds, no hepatosplenomegaly Extremities: no clubbing, cyanosis or edema; 2+ radial, ulnar and brachial pulses bilaterally; 2+ right femoral, posterior tibial and dorsalis pedis pulses; 2+ left femoral, posterior tibial and dorsalis pedis pulses; no subclavian or femoral bruits Neurological: grossly nonfocal Psych: Normal mood and affect     Wt Readings from Last 3  Encounters:  10/27/22 131 lb 12.8 oz (59.8 kg)  11/23/21 164 lb (74.4 kg)  05/21/21 179 lb (81.2 kg)      Studies/Labs Reviewed:   EKG:  EKG is ordered today.  It shows normal sinus rhythm and is a completely normal tracing.  Recent Labs: No results found for requested labs within last 365 days.  02/21/2021 Hemoglobin A1c 7.2% Lipid Panel    Component Value Date/Time   CHOL 189 03/11/2008 2045   TRIG 180 (H) 03/11/2008 2045   HDL 69 03/11/2008 2045   CHOLHDL 2.7 Ratio 03/11/2008 2045   VLDL 36 03/11/2008 2045   LDLCALC 84 03/11/2008 2045    10/24/2020 Cholesterol 137, HDL 45, LDL 63, triglycerides 172  ASSESSMENT:    1. Elevated coronary artery calcium score   2. Controlled type 2 diabetes mellitus without complication, with long-term current use of insulin (HCC)   3. Supraventricular tachycardia   4. Mixed hyperlipidemia   5. Left lower lobe pulmonary nodule   6. Left thyroid nodule       PLAN:  In order of problems listed above:  Coronary calcification: No significant obstruction on coronary CT angiography, but elevated coronary calcium score.  The focus is on risk factor treatment.Syncope: When she started Jardiance she developed some hypotension and we cut back on her beta-blocker.  She has not had syncope recently. DM: Excellent glycemic control and marked weight loss on Mounjaro.  On losartan for renal protection, not for hypertension. SVT: No symptomatic recurrence with a tiny dose of beta-blocker is the only therapy. HLP: All lipid parameters are well within target range.  Continue atorvastatin. LLL nodule: Will schedule for repeat CT in September.  This should also allow [place] to re-evaluate the thyroid nodule.  Medication Adjustments/Labs and Tests Ordered: Current medicines are reviewed at length with the patient today.  Concerns regarding medicines are outlined above.  Medication changes, Labs and Tests ordered today are listed in the Patient Instructions  below. Patient Instructions  Medication Instructions:  No changes *If you need a refill on your cardiac medications before your next appointment, please call your pharmacy*  Testing/Procedures: CT-scan of the  chest without contrast needed in mid September 2024 to follow up on Left lower lobe nodule  Follow-Up: At [facility], you and your health needs are our priority.  As part of our continuing mission to provide you with exceptional heart care, we have created designated Provider Care Teams.  These Care Teams include your primary Cardiologist (physician) and Advanced Practice Providers (APPs -  Physician Assistants and Nurse Practitioners) who all work together to provide you with the care you need, when you need it.  We recommend signing up for the patient portal called "MyChart".  Sign up information is provided on this After Visit Summary.  MyChart is used to connect with patients for Virtual Visits (Telemedicine).  Patients are able to view lab/test results, encounter notes, upcoming appointments, etc.  Non-urgent messages can be sent to your provider as well.   To learn more about what you can do with MyChart, go to [email or web address].    Your next appointment:   1 year(s)  Provider:   Thurmon Fair, MD       Signed, Thurmon Fair, MD  10/27/2022 7:03 PM    [facility] [address] [place], [place], [place]  [address] Phone: [phone]; Fax: [phone]

## 2022-11-03 DIAGNOSIS — J3089 Other allergic rhinitis: Secondary | ICD-10-CM | POA: Diagnosis not present

## 2022-11-03 DIAGNOSIS — J301 Allergic rhinitis due to pollen: Secondary | ICD-10-CM | POA: Diagnosis not present

## 2022-11-10 DIAGNOSIS — J301 Allergic rhinitis due to pollen: Secondary | ICD-10-CM | POA: Diagnosis not present

## 2022-11-10 DIAGNOSIS — J3081 Allergic rhinitis due to animal (cat) (dog) hair and dander: Secondary | ICD-10-CM | POA: Diagnosis not present

## 2022-11-10 DIAGNOSIS — J3089 Other allergic rhinitis: Secondary | ICD-10-CM | POA: Diagnosis not present

## 2022-11-17 DIAGNOSIS — J3089 Other allergic rhinitis: Secondary | ICD-10-CM | POA: Diagnosis not present

## 2022-11-17 DIAGNOSIS — Z1231 Encounter for screening mammogram for malignant neoplasm of breast: Secondary | ICD-10-CM | POA: Diagnosis not present

## 2022-11-17 DIAGNOSIS — J3081 Allergic rhinitis due to animal (cat) (dog) hair and dander: Secondary | ICD-10-CM | POA: Diagnosis not present

## 2022-11-17 DIAGNOSIS — J301 Allergic rhinitis due to pollen: Secondary | ICD-10-CM | POA: Diagnosis not present

## 2022-11-19 NOTE — Progress Notes (Signed)
77 y.o. G52P0 Married Caucasian female here for office visit.  She is followed for osteopenia.  Larey Seat last year and did not have any fracture.  Hx fracture of right arm and right ankle 4 - 5 years ago.   Her last BMD was 8//15/23.  T score left femoral neck: -1.4. Right femoral neck: -1.0 Forearm normal.  FRAX scores:  2.8% and 16%  Her last pap she believes was about 2020.  No record in Epic.   No vaginal bleeding or discharge.   Lost 45 pounds last year.  Took Monjouaro for diabetes.  A1C 6.0.    Had a CT of heart and is now doing follow up for lung nodule and thyroid nodule.   Retired from school system.  Second marriage, 19 years today.  5 children together.  They like to travel.  Enjoy dogs.   PCP:  Nita Sells   No LMP recorded. Patient is postmenopausal.           Sexually active: No.  The current method of family planning is post menopausal status.    Exercising: No.   exercise Smoker:  no  Health Maintenance: Pap: Neg 2018 per ML note, 05/22/07 WNL History of abnormal Pap:  no MMG:  2024 [facility] (pt to have faxed).  States she is due July, 2025.  Colonoscopy:  cologuard 06-23-22 neg BMD:   12/01/21  Result  osteopenia TDaP:  done with pcp Gardasil:   no HIV: never done Hep C: never done Screening Labs: PCP   reports that she has never smoked. She has never used smokeless tobacco. She reports that she does not drink alcohol and does not use drugs.  Past Medical History:  Diagnosis Date   Chest pain 09/16/2010   normal myocardial perfusion study EF=61%   Diabetes mellitus (HCC)    type 2   GERD (gastroesophageal reflux disease)    History of cardiac murmur 02/23/00   2D Echo EF greater than 55%   Hyperlipemia    Obesity    Osteopenia 06/2017   T score -1.2 FRAX 9% / 1.1%   Palpitation    Seasonal allergies    SVT (supraventricular tachycardia)    Systemic hypertension     Past Surgical History:  Procedure Laterality Date   CATARACT  EXTRACTION Right 2003   [facility]   CATARACT EXTRACTION W/PHACO Left 04/09/2013   Procedure: LEFT EYE CATARACT EXTRACTION PHACO AND INTRAOCULAR LENS PLACEMENT ;  Surgeon: Gemma Payor, MD;  Location: [facility];  Service: Ophthalmology;  Laterality: Left;  CDE 10.78   MOUTH SURGERY  04/2017   spot removed by Dr. Jeanice Lim    TONSILLECTOMY  [phone]   TUBAL LIGATION  1980    Current Outpatient Medications  Medication Sig Dispense Refill   aspirin 81 MG tablet Take 81 mg by mouth daily.     atorvastatin (LIPITOR) 80 MG tablet Take 80 mg by mouth daily.     azelastine (OPTIVAR) 0.05 % ophthalmic solution Apply 1 drop to eye 2 (two) times daily.     FLUoxetine (PROZAC) 20 MG capsule Take 20 mg by mouth every morning.     levocetirizine (XYZAL) 5 MG tablet Take 5 mg by mouth every evening.     losartan (COZAAR) 25 MG tablet Take 25 mg by mouth daily.     metFORMIN (GLUCOPHAGE) 1000 MG tablet Take 1,000 mg by mouth 2 (two) times daily with a meal.     MOUNJARO 5 MG/0.5ML Pen Inject into the  skin once a week.     Multiple Vitamin (MULTIVITAMIN) capsule Take 1 capsule by mouth daily.     omeprazole (PRILOSEC) 20 MG capsule Take 20 mg by mouth every other day.      EPINEPHrine 0.3 mg/0.3 mL IJ SOAJ injection See admin instructions. (Patient not taking: Reported on 10/27/2022)     glucose 4 GM chewable tablet Chew 0.5 tablets by mouth as needed for low blood sugar. (Patient not taking: Reported on 10/27/2022)     metoprolol succinate (TOPROL-XL) 25 MG 24 hr tablet Take 0.5 tablets (12.5 mg total) by mouth daily. (Patient not taking: Reported on 12/02/2022) 45 tablet 3   No current facility-administered medications for this visit.    Family History  Problem Relation Age of Onset   Parkinson's disease Father    Diabetes Father     Review of Systems  Constitutional: Negative.   HENT: Negative.    Eyes: Negative.   Respiratory: Negative.    Cardiovascular: Negative.   Gastrointestinal: Negative.    Endocrine: Negative.   Genitourinary: Negative.   Musculoskeletal: Negative.   Skin: Negative.   Allergic/Immunologic: Negative.   Neurological: Negative.   Hematological: Negative.   Psychiatric/Behavioral: Negative.      Exam:   BP 116/64   Pulse 84   Resp 16   Ht 5\' 2"  (1.575 m)   Wt 131 lb (59.4 kg)   BMI 23.96 kg/m     General appearance: alert, cooperative and appears stated age Head: normocephalic, without obvious abnormality, atraumatic Neck: no adenopathy, supple, symmetrical, trachea midline and thyroid normal to inspection and palpation Lungs: clear to auscultation bilaterally Heart: regular rate and rhythm Abdomen: soft, non-tender; no masses, no organomegaly Extremities: extremities normal, atraumatic, no cyanosis or edema Skin: skin color, texture, turgor normal. No rashes or lesions Lymph nodes: cervical, supraclavicular, and axillary nodes normal. Neurologic: grossly normal  Assessment:   Osteopenia left femoral neck.  Health education.  Plan: Mammogram screening discussed.  She does this yearly at [facility].  Will request copy.  Pap guidelines reviewed.  Ok for the patient to stop cervical cancer screening. We discussed her BMD results.  Guidelines for Calcium, Vitamin D, regular exercise program including weight bearing exercise. BMD in August 2024.  Breast and pelvic exam and followup in one year.   30 min  total time was spent for this patient encounter, including preparation, face-to-face counseling with the patient, coordination of care, and documentation of the encounter.

## 2022-11-24 DIAGNOSIS — J3089 Other allergic rhinitis: Secondary | ICD-10-CM | POA: Diagnosis not present

## 2022-11-24 DIAGNOSIS — J301 Allergic rhinitis due to pollen: Secondary | ICD-10-CM | POA: Diagnosis not present

## 2022-11-24 DIAGNOSIS — J3081 Allergic rhinitis due to animal (cat) (dog) hair and dander: Secondary | ICD-10-CM | POA: Diagnosis not present

## 2022-11-26 ENCOUNTER — Ambulatory Visit: Payer: PPO | Admitting: Obstetrics & Gynecology

## 2022-12-01 DIAGNOSIS — J3089 Other allergic rhinitis: Secondary | ICD-10-CM | POA: Diagnosis not present

## 2022-12-01 DIAGNOSIS — J301 Allergic rhinitis due to pollen: Secondary | ICD-10-CM | POA: Diagnosis not present

## 2022-12-02 ENCOUNTER — Ambulatory Visit: Payer: PPO | Admitting: Obstetrics and Gynecology

## 2022-12-02 ENCOUNTER — Encounter: Payer: Self-pay | Admitting: Obstetrics and Gynecology

## 2022-12-02 VITALS — BP 116/64 | HR 84 | Resp 16 | Ht 62.0 in | Wt 131.0 lb

## 2022-12-02 DIAGNOSIS — M8580 Other specified disorders of bone density and structure, unspecified site: Secondary | ICD-10-CM

## 2022-12-02 DIAGNOSIS — Z719 Counseling, unspecified: Secondary | ICD-10-CM | POA: Diagnosis not present

## 2022-12-05 NOTE — Patient Instructions (Signed)
Osteopenia  Osteopenia is a loss of thickness (density) inside the bones. Another name for osteopenia is low bone mass. Mild osteopenia is a normal part of aging. It is not a disease, and it does not cause symptoms. However, if you have osteopenia and continue to lose bone mass, you could develop a condition that causes the bones to become thin and break more easily (osteoporosis). Osteoporosis can cause you to lose some height, have back pain, and have a stooped posture. Although osteopenia is not a disease, making changes to your lifestyle and diet can help to prevent osteopenia from developing into osteoporosis. What are the causes? Osteopenia is caused by loss of calcium in the bones. Bones are constantly changing. Old bone cells are continually being replaced with new bone cells. This process builds new bone. The mineral calcium is needed to build new bone and maintain bone density. Bone density is usually highest around age 94. After that, most people's bodies cannot replace all the bone they have lost with new bone. What increases the risk? You are more likely to develop this condition if: You are older than age 53. You are a woman who went through menopause early. You have a long illness that keeps you in bed. You do not get enough exercise. You lack certain nutrients (malnutrition). You have an overactive thyroid gland (hyperthyroidism). You use products that contain nicotine or tobacco, such as cigarettes, e-cigarettes and chewing tobacco, or you drink a lot of alcohol. You are taking medicines that weaken the bones, such as steroids. What are the signs or symptoms? This condition does not cause any symptoms. You may have a slightly higher risk for bone breaks (fractures), so getting fractures more easily than normal may be an indication of osteopenia. How is this diagnosed? This condition may be diagnosed based on an X-ray exam that measures bone density (dual-energy X-ray  absorptiometry, or DEXA). This test can measure bone density in your hips, spine, and wrists. Osteopenia has no symptoms, so this condition is usually diagnosed after a routine bone density screening test is done for osteoporosis. This routine screening is usually done for: Women who are age 63 or older. Men who are age 41 or older. If you have risk factors for osteopenia, you may have the screening test at an earlier age. How is this treated? Making dietary and lifestyle changes can lower your risk for osteoporosis. If you have severe osteopenia that is close to becoming osteoporosis, this condition can be treated with medicines and dietary supplements such as calcium and vitamin D. These supplements help to rebuild bone density. Follow these instructions at home: Eating and drinking Eat a diet that is high in calcium and vitamin D. Calcium is found in dairy products, beans, salmon, and leafy green vegetables like spinach and broccoli. Look for foods that have vitamin D and calcium added to them (fortified foods), such as orange juice, cereal, and bread.  Lifestyle Do 30 minutes or more of a weight-bearing exercise every day, such as walking, jogging, or playing a sport. These types of exercises strengthen the bones. Do not use any products that contain nicotine or tobacco, such as cigarettes, e-cigarettes, and chewing tobacco. If you need help quitting, ask your health care provider. Do not drink alcohol if: Your health care provider tells you not to drink. You are pregnant, may be pregnant, or are planning to become pregnant. If you drink alcohol: Limit how much you use to: 0-1 drink a day for women. 0-2  drinks a day for men. Be aware of how much alcohol is in your drink. In the [place] one drink equals one 12 oz bottle of beer (355 mL), one 5 oz glass of wine (148 mL), or one 1 oz glass of hard liquor (44 mL). General instructions Take over-the-counter and prescription medicines only as  told by your health care provider. These include vitamins and supplements. Take precautions at home to lower your risk of falling, such as: Keeping rooms well-lit and free of clutter, such as cords. Installing safety rails on stairs. Using rubber mats in the bathroom or other areas that are often wet or slippery. Keep all follow-up visits. This is important. Contact a health care provider if: You have not had a bone density screening for osteoporosis and you are: A woman who is age 72 or older. A man who is age 15 or older. You are a postmenopausal woman who has not had a bone density screening for osteoporosis. You are older than age 37 and you want to know if you should have bone density screening for osteoporosis. Summary Osteopenia is a loss of thickness (density) inside the bones. Another name for osteopenia is low bone mass. Osteopenia is not a disease, but it may increase your risk for a condition that causes the bones to become thin and break more easily (osteoporosis). You may be at risk for osteopenia if you are older than age 56 or if you are a woman who went through early menopause. Osteopenia does not cause any symptoms, but it can be diagnosed with a bone density screening test. Dietary and lifestyle changes are the first treatment for osteopenia. These may lower your risk for osteoporosis. This information is not intended to replace advice given to you by your health care provider. Make sure you discuss any questions you have with your health care provider. Document Revised: 09/20/2019 Document Reviewed: 09/20/2019 Elsevier Patient Education  2024 [employer].

## 2022-12-08 DIAGNOSIS — J301 Allergic rhinitis due to pollen: Secondary | ICD-10-CM | POA: Diagnosis not present

## 2022-12-08 DIAGNOSIS — J3081 Allergic rhinitis due to animal (cat) (dog) hair and dander: Secondary | ICD-10-CM | POA: Diagnosis not present

## 2022-12-08 DIAGNOSIS — J3089 Other allergic rhinitis: Secondary | ICD-10-CM | POA: Diagnosis not present

## 2022-12-15 DIAGNOSIS — J301 Allergic rhinitis due to pollen: Secondary | ICD-10-CM | POA: Diagnosis not present

## 2022-12-15 DIAGNOSIS — J3081 Allergic rhinitis due to animal (cat) (dog) hair and dander: Secondary | ICD-10-CM | POA: Diagnosis not present

## 2022-12-15 DIAGNOSIS — J3089 Other allergic rhinitis: Secondary | ICD-10-CM | POA: Diagnosis not present

## 2022-12-22 DIAGNOSIS — J3089 Other allergic rhinitis: Secondary | ICD-10-CM | POA: Diagnosis not present

## 2022-12-22 DIAGNOSIS — J3081 Allergic rhinitis due to animal (cat) (dog) hair and dander: Secondary | ICD-10-CM | POA: Diagnosis not present

## 2022-12-22 DIAGNOSIS — J301 Allergic rhinitis due to pollen: Secondary | ICD-10-CM | POA: Diagnosis not present

## 2022-12-29 DIAGNOSIS — J3081 Allergic rhinitis due to animal (cat) (dog) hair and dander: Secondary | ICD-10-CM | POA: Diagnosis not present

## 2022-12-29 DIAGNOSIS — J301 Allergic rhinitis due to pollen: Secondary | ICD-10-CM | POA: Diagnosis not present

## 2022-12-29 DIAGNOSIS — J3089 Other allergic rhinitis: Secondary | ICD-10-CM | POA: Diagnosis not present

## 2023-01-05 DIAGNOSIS — J3081 Allergic rhinitis due to animal (cat) (dog) hair and dander: Secondary | ICD-10-CM | POA: Diagnosis not present

## 2023-01-05 DIAGNOSIS — J3089 Other allergic rhinitis: Secondary | ICD-10-CM | POA: Diagnosis not present

## 2023-01-05 DIAGNOSIS — J301 Allergic rhinitis due to pollen: Secondary | ICD-10-CM | POA: Diagnosis not present

## 2023-01-12 DIAGNOSIS — E1169 Type 2 diabetes mellitus with other specified complication: Secondary | ICD-10-CM | POA: Diagnosis not present

## 2023-01-12 DIAGNOSIS — E782 Mixed hyperlipidemia: Secondary | ICD-10-CM | POA: Diagnosis not present

## 2023-01-12 DIAGNOSIS — J3089 Other allergic rhinitis: Secondary | ICD-10-CM | POA: Diagnosis not present

## 2023-01-12 DIAGNOSIS — J3081 Allergic rhinitis due to animal (cat) (dog) hair and dander: Secondary | ICD-10-CM | POA: Diagnosis not present

## 2023-01-12 DIAGNOSIS — J301 Allergic rhinitis due to pollen: Secondary | ICD-10-CM | POA: Diagnosis not present

## 2023-01-18 DIAGNOSIS — F329 Major depressive disorder, single episode, unspecified: Secondary | ICD-10-CM | POA: Diagnosis not present

## 2023-01-18 DIAGNOSIS — D509 Iron deficiency anemia, unspecified: Secondary | ICD-10-CM | POA: Diagnosis not present

## 2023-01-18 DIAGNOSIS — M8580 Other specified disorders of bone density and structure, unspecified site: Secondary | ICD-10-CM | POA: Diagnosis not present

## 2023-01-18 DIAGNOSIS — R911 Solitary pulmonary nodule: Secondary | ICD-10-CM | POA: Diagnosis not present

## 2023-01-18 DIAGNOSIS — E1169 Type 2 diabetes mellitus with other specified complication: Secondary | ICD-10-CM | POA: Diagnosis not present

## 2023-01-18 DIAGNOSIS — E7800 Pure hypercholesterolemia, unspecified: Secondary | ICD-10-CM | POA: Diagnosis not present

## 2023-01-18 DIAGNOSIS — J309 Allergic rhinitis, unspecified: Secondary | ICD-10-CM | POA: Diagnosis not present

## 2023-01-18 DIAGNOSIS — E875 Hyperkalemia: Secondary | ICD-10-CM | POA: Diagnosis not present

## 2023-01-18 DIAGNOSIS — E041 Nontoxic single thyroid nodule: Secondary | ICD-10-CM | POA: Diagnosis not present

## 2023-01-18 DIAGNOSIS — I10 Essential (primary) hypertension: Secondary | ICD-10-CM | POA: Diagnosis not present

## 2023-01-18 DIAGNOSIS — E1142 Type 2 diabetes mellitus with diabetic polyneuropathy: Secondary | ICD-10-CM | POA: Diagnosis not present

## 2023-01-18 DIAGNOSIS — Z23 Encounter for immunization: Secondary | ICD-10-CM | POA: Diagnosis not present

## 2023-01-20 DIAGNOSIS — J3089 Other allergic rhinitis: Secondary | ICD-10-CM | POA: Diagnosis not present

## 2023-01-20 DIAGNOSIS — J301 Allergic rhinitis due to pollen: Secondary | ICD-10-CM | POA: Diagnosis not present

## 2023-01-20 DIAGNOSIS — J3081 Allergic rhinitis due to animal (cat) (dog) hair and dander: Secondary | ICD-10-CM | POA: Diagnosis not present

## 2023-01-28 DIAGNOSIS — J301 Allergic rhinitis due to pollen: Secondary | ICD-10-CM | POA: Diagnosis not present

## 2023-01-28 DIAGNOSIS — J3089 Other allergic rhinitis: Secondary | ICD-10-CM | POA: Diagnosis not present

## 2023-02-03 ENCOUNTER — Ambulatory Visit (HOSPITAL_COMMUNITY)
Admission: RE | Admit: 2023-02-03 | Discharge: 2023-02-03 | Disposition: A | Discharge status: Home / Self Care | Payer: PPO | Source: Ambulatory Visit | Attending: Cardiovascular Disease | Admitting: Cardiovascular Disease

## 2023-02-03 DIAGNOSIS — R911 Solitary pulmonary nodule: Secondary | ICD-10-CM | POA: Insufficient documentation

## 2023-02-03 DIAGNOSIS — E041 Nontoxic single thyroid nodule: Secondary | ICD-10-CM | POA: Diagnosis not present

## 2023-02-03 DIAGNOSIS — I728 Aneurysm of other specified arteries: Secondary | ICD-10-CM | POA: Diagnosis not present

## 2023-02-04 DIAGNOSIS — J3081 Allergic rhinitis due to animal (cat) (dog) hair and dander: Secondary | ICD-10-CM | POA: Diagnosis not present

## 2023-02-04 DIAGNOSIS — J301 Allergic rhinitis due to pollen: Secondary | ICD-10-CM | POA: Diagnosis not present

## 2023-02-04 DIAGNOSIS — J3089 Other allergic rhinitis: Secondary | ICD-10-CM | POA: Diagnosis not present

## 2023-02-06 ENCOUNTER — Other Ambulatory Visit: Payer: Self-pay

## 2023-02-06 ENCOUNTER — Encounter (HOSPITAL_COMMUNITY): Payer: Self-pay

## 2023-02-06 ENCOUNTER — Emergency Department (HOSPITAL_COMMUNITY)
Admission: EM | Admit: 2023-02-06 | Discharge: 2023-02-06 | Disposition: A | Discharge status: Home / Self Care | Payer: PPO | Attending: Emergency Medicine | Admitting: Emergency Medicine

## 2023-02-06 ENCOUNTER — Emergency Department (HOSPITAL_COMMUNITY): Payer: PPO

## 2023-02-06 DIAGNOSIS — R0600 Dyspnea, unspecified: Secondary | ICD-10-CM | POA: Diagnosis not present

## 2023-02-06 DIAGNOSIS — N3000 Acute cystitis without hematuria: Secondary | ICD-10-CM | POA: Diagnosis not present

## 2023-02-06 DIAGNOSIS — K219 Gastro-esophageal reflux disease without esophagitis: Secondary | ICD-10-CM | POA: Insufficient documentation

## 2023-02-06 DIAGNOSIS — I2699 Other pulmonary embolism without acute cor pulmonale: Secondary | ICD-10-CM | POA: Diagnosis not present

## 2023-02-06 DIAGNOSIS — R61 Generalized hyperhidrosis: Secondary | ICD-10-CM | POA: Insufficient documentation

## 2023-02-06 DIAGNOSIS — Z79899 Other long term (current) drug therapy: Secondary | ICD-10-CM | POA: Diagnosis not present

## 2023-02-06 DIAGNOSIS — R071 Chest pain on breathing: Secondary | ICD-10-CM | POA: Diagnosis not present

## 2023-02-06 DIAGNOSIS — R0789 Other chest pain: Secondary | ICD-10-CM | POA: Diagnosis not present

## 2023-02-06 DIAGNOSIS — R911 Solitary pulmonary nodule: Secondary | ICD-10-CM | POA: Insufficient documentation

## 2023-02-06 DIAGNOSIS — R350 Frequency of micturition: Secondary | ICD-10-CM | POA: Diagnosis not present

## 2023-02-06 DIAGNOSIS — R000 Tachycardia, unspecified: Secondary | ICD-10-CM | POA: Diagnosis not present

## 2023-02-06 DIAGNOSIS — Z7985 Long-term (current) use of injectable non-insulin antidiabetic drugs: Secondary | ICD-10-CM | POA: Diagnosis not present

## 2023-02-06 DIAGNOSIS — Z794 Long term (current) use of insulin: Secondary | ICD-10-CM | POA: Insufficient documentation

## 2023-02-06 DIAGNOSIS — I10 Essential (primary) hypertension: Secondary | ICD-10-CM | POA: Insufficient documentation

## 2023-02-06 DIAGNOSIS — I700 Atherosclerosis of aorta: Secondary | ICD-10-CM | POA: Diagnosis not present

## 2023-02-06 DIAGNOSIS — Z7982 Long term (current) use of aspirin: Secondary | ICD-10-CM | POA: Diagnosis not present

## 2023-02-06 DIAGNOSIS — R6883 Chills (without fever): Secondary | ICD-10-CM | POA: Insufficient documentation

## 2023-02-06 DIAGNOSIS — E041 Nontoxic single thyroid nodule: Secondary | ICD-10-CM | POA: Diagnosis not present

## 2023-02-06 DIAGNOSIS — D649 Anemia, unspecified: Secondary | ICD-10-CM | POA: Insufficient documentation

## 2023-02-06 DIAGNOSIS — M791 Myalgia, unspecified site: Secondary | ICD-10-CM | POA: Diagnosis not present

## 2023-02-06 DIAGNOSIS — E119 Type 2 diabetes mellitus without complications: Secondary | ICD-10-CM | POA: Diagnosis not present

## 2023-02-06 DIAGNOSIS — Z1152 Encounter for screening for COVID-19: Secondary | ICD-10-CM | POA: Insufficient documentation

## 2023-02-06 DIAGNOSIS — I491 Atrial premature depolarization: Secondary | ICD-10-CM | POA: Insufficient documentation

## 2023-02-06 DIAGNOSIS — E782 Mixed hyperlipidemia: Secondary | ICD-10-CM | POA: Insufficient documentation

## 2023-02-06 DIAGNOSIS — Z20822 Contact with and (suspected) exposure to covid-19: Secondary | ICD-10-CM | POA: Insufficient documentation

## 2023-02-06 DIAGNOSIS — D72829 Elevated white blood cell count, unspecified: Secondary | ICD-10-CM | POA: Diagnosis not present

## 2023-02-06 DIAGNOSIS — R0989 Other specified symptoms and signs involving the circulatory and respiratory systems: Secondary | ICD-10-CM | POA: Diagnosis not present

## 2023-02-06 DIAGNOSIS — I728 Aneurysm of other specified arteries: Secondary | ICD-10-CM | POA: Diagnosis not present

## 2023-02-06 LAB — CBC WITH DIFFERENTIAL/PLATELET
Abs Immature Granulocytes: 0.07 10*3/uL (ref 0.00–0.07)
Basophils Absolute: 0.1 10*3/uL (ref 0.0–0.1)
Basophils Relative: 0 %
Eosinophils Absolute: 0.2 10*3/uL (ref 0.0–0.5)
Eosinophils Relative: 2 %
HCT: 33.9 % — ABNORMAL LOW (ref 36.0–46.0)
Hemoglobin: 10.5 g/dL — ABNORMAL LOW (ref 12.0–15.0)
Immature Granulocytes: 1 %
Lymphocytes Relative: 15 %
Lymphs Abs: 1.7 10*3/uL (ref 0.7–4.0)
MCH: 29.3 pg (ref 26.0–34.0)
MCHC: 31 g/dL (ref 30.0–36.0)
MCV: 94.7 fL (ref 80.0–100.0)
Monocytes Absolute: 0.8 10*3/uL (ref 0.1–1.0)
Monocytes Relative: 7 %
Neutro Abs: 8.6 10*3/uL — ABNORMAL HIGH (ref 1.7–7.7)
Neutrophils Relative %: 75 %
Platelets: 401 10*3/uL — ABNORMAL HIGH (ref 150–400)
RBC: 3.58 MIL/uL — ABNORMAL LOW (ref 3.87–5.11)
RDW: 12.2 % (ref 11.5–15.5)
WBC: 11.4 10*3/uL — ABNORMAL HIGH (ref 4.0–10.5)
nRBC: 0 % (ref 0.0–0.2)

## 2023-02-06 LAB — URINALYSIS, ROUTINE W REFLEX MICROSCOPIC
Bilirubin Urine: NEGATIVE
Glucose, UA: NEGATIVE mg/dL
Hgb urine dipstick: NEGATIVE
Ketones, ur: NEGATIVE mg/dL
Nitrite: NEGATIVE
Protein, ur: 30 mg/dL — AB
Specific Gravity, Urine: 1.021 (ref 1.005–1.030)
WBC, UA: >50 WBC/hpf (ref 0–5)
pH: 5 (ref 5.0–8.0)

## 2023-02-06 LAB — COMPREHENSIVE METABOLIC PANEL
ALT: 17 U/L (ref 0–44)
AST: 16 U/L (ref 15–41)
Albumin: 2.9 g/dL — ABNORMAL LOW (ref 3.5–5.0)
Alkaline Phosphatase: 78 U/L (ref 38–126)
Anion gap: 12 (ref 5–15)
BUN: 21 mg/dL (ref 8–23)
CO2: 22 mmol/L (ref 22–32)
Calcium: 9.2 mg/dL (ref 8.9–10.3)
Chloride: 102 mmol/L (ref 98–111)
Creatinine, Ser: 0.8 mg/dL (ref 0.44–1.00)
GFR, Estimated: >60 mL/min (ref >60)
Glucose, Bld: 240 mg/dL — ABNORMAL HIGH (ref 70–99)
Potassium: 4.7 mmol/L (ref 3.5–5.1)
Sodium: 136 mmol/L (ref 135–145)
Total Bilirubin: 0.8 mg/dL (ref 0.3–1.2)
Total Protein: 7.3 g/dL (ref 6.5–8.1)

## 2023-02-06 LAB — TROPONIN I (HIGH SENSITIVITY)
Troponin I (High Sensitivity): 4 ng/L (ref <18)
Troponin I (High Sensitivity): 4 ng/L (ref <18)

## 2023-02-06 LAB — TSH: TSH: 1.874 u[IU]/mL (ref 0.350–4.500)

## 2023-02-06 LAB — RESP PANEL BY RT-PCR (RSV, FLU A&B, COVID)  RVPGX2
Influenza A by PCR: NEGATIVE
Influenza B by PCR: NEGATIVE
Resp Syncytial Virus by PCR: NEGATIVE
SARS Coronavirus 2 by RT PCR: NEGATIVE

## 2023-02-06 LAB — T4, FREE: Free T4: 1.12 ng/dL (ref 0.61–1.12)

## 2023-02-06 LAB — BRAIN NATRIURETIC PEPTIDE: B Natriuretic Peptide: 91 pg/mL (ref 0.0–100.0)

## 2023-02-06 MED ORDER — IOHEXOL 350 MG/ML SOLN
75.0000 mL | Freq: Once | INTRAVENOUS | Status: AC | PRN
  Administered 2023-02-06: 75 mL via INTRAVENOUS

## 2023-02-06 MED ORDER — IOHEXOL 350 MG/ML SOLN: 100.0000 mL | Freq: Once | INTRAVENOUS | Status: DC | PRN

## 2023-02-06 MED ORDER — SODIUM CHLORIDE 0.9 % IV SOLN
1.0000 g | Freq: Once | INTRAVENOUS | Status: AC
  Administered 2023-02-06: 1 g via INTRAVENOUS
  Filled 2023-02-06: qty 10

## 2023-02-06 MED ORDER — SODIUM CHLORIDE 0.9 % IV BOLUS
1000.0000 mL | Freq: Once | INTRAVENOUS | Status: AC
  Administered 2023-02-06: 1000 mL via INTRAVENOUS

## 2023-02-06 MED ORDER — CEPHALEXIN 500 MG PO CAPS: 500.0000 mg | ORAL_CAPSULE | Freq: Three times a day (TID) | ORAL | 0 refills | Status: AC

## 2023-02-06 NOTE — ED Provider Notes (Signed)
testing today shows a urinary infection, no signs of blood clot on CT scan, patient is otherwise well-appearing, informed of results, given antibiotics, home on cephalexin   Brenda Hong, MD 02/06/23 1807

## 2023-02-06 NOTE — Discharge Instructions (Addendum)
It was a pleasure caring for you today in the emergency department.  Please return to the emergency department for any worsening or worrisome symptoms.  You Do have what appears to be a urinary infection.   the CT scan of your chest does not show any signs of pneumonia, heart disease or blood clots.  You can follow-up with your doctor this week, ER for worsening symptoms  You have been prescribed Cephalexin, 500 mg capsules.  This is an antibiotic that is used to treat multiple different infections, please take this medicine until it has completed - even if you are feeling better immediately.  As a side effect, this may cause diarrhea in some people, if you are developing any signs of an allergic reaction please stop the medication immediately and call your doctor or return to the emergency department for worsening symptoms

## 2023-02-06 NOTE — ED Triage Notes (Signed)
C/o substernal chest pressure radiating through to back with night sweats, body aches, headache, and chills x1 week.  Patient reports relief when laying down.  Denies sob Ambulatory to triage.

## 2023-02-06 NOTE — ED Provider Notes (Signed)
[place] EMERGENCY DEPARTMENT AT [facility] Provider Note  CSN: [id number] Arrival date & time: 02/06/23 1052  Chief Complaint(s) Chest Pain  HPI Brenda Hatfield is a 77 y.o. female with past medical history as below, significant for DM 2, GERD, HLD, SVT, hypertension, pulmonary nodule who presents to the ED with complaint of chest pain, body aches, night sweats.  Patient reports feeling unwell for approximately 1 to 1.5 weeks.  She is having chest pain, worsened with exertion or deep inspiration.  Bodyaches diffusely improved with Motrin.  Having chills intermittently and night sweats over the past week.  No fevers.  No nausea or vomiting.  No dyspnea.  No fevers, no recent travel or sick contacts.  She had a CT chest 10/17 and the results have not made available yet prior lung nodule  Past Medical History Past Medical History:  Diagnosis Date   Chest pain 09/16/2010   normal myocardial perfusion study EF=61%   Diabetes mellitus (HCC)    type 2   GERD (gastroesophageal reflux disease)    History of cardiac murmur 02/23/00   2D Echo EF greater than 55%   Hyperlipemia    Obesity    Osteopenia 06/2017   T score -1.2 FRAX 9% / 1.1%   Palpitation    Seasonal allergies    SVT (supraventricular tachycardia) (HCC)    Systemic hypertension    Patient Active Problem List   Diagnosis Date Noted   Controlled type 2 diabetes mellitus without complication, with long-term current use of insulin (HCC) 04/22/2018   Closed avulsion fracture of distal end of right fibula 09/14/17 10/14/2017   Vasovagal syncope 01/10/2017   Supraventricular tachycardia (HCC) 01/10/2017   WEAKNESS 11/22/2008   Type 2 diabetes mellitus with obesity (HCC) 01/05/2008   Mild obesity 10/06/2007   Vitamin D deficiency 07/06/2007   ONYCHOLYSIS 07/06/2007   Essential hypertension 09/26/2006   SINUSITIS, ACUTE 05/27/2006   CYST, THYROID 04/23/2006   Mixed hyperlipidemia 04/23/2006   ANEMIA-NOS  04/23/2006   DEPRESSION 04/23/2006   CATARACT NOS 04/23/2006   GERD 04/23/2006   Constipation 04/23/2006   Osteoarthritis 04/23/2006   Home Medication(s) Prior to Admission medications   Medication Sig Start Date End Date Taking? Authorizing Provider  cephALEXin (KEFLEX) 500 MG capsule Take 1 capsule (500 mg total) by mouth 3 (three) times daily for 7 days. 02/06/23 02/13/23 Yes Tanda Rockers A, DO  aspirin 81 MG tablet Take 81 mg by mouth daily.    [provider]  atorvastatin (LIPITOR) 80 MG tablet Take 80 mg by mouth daily. 03/01/22   [provider]  azelastine (OPTIVAR) 0.05 % ophthalmic solution Apply 1 drop to eye 2 (two) times daily. 03/29/21   [provider]  EPINEPHrine 0.3 mg/0.3 mL IJ SOAJ injection See admin instructions. Patient not taking: Reported on 10/27/2022    [provider]  FLUoxetine (PROZAC) 20 MG capsule Take 20 mg by mouth every morning. 02/20/21   [provider]  glucose 4 GM chewable tablet Chew 0.5 tablets by mouth as needed for low blood sugar. Patient not taking: Reported on 10/27/2022    [provider]  levocetirizine (XYZAL) 5 MG tablet Take 5 mg by mouth every evening.    [provider]  losartan (COZAAR) 25 MG tablet Take 25 mg by mouth daily.    [provider]  metFORMIN (GLUCOPHAGE) 1000 MG tablet Take 1,000 mg by mouth 2 (two) times daily with a meal.    [provider]  metoprolol succinate (TOPROL-XL) 25 MG 24 hr tablet Take 0.5 tablets (12.5 mg total) by mouth daily. Patient not taking: Reported on 12/02/2022 05/30/19   Croitoru, Rachelle Hora, MD  [facility] 5 MG/0.5ML Pen Inject into the skin once a week. 11/13/21   [provider]  Multiple Vitamin (MULTIVITAMIN) capsule Take 1 capsule by mouth daily.    [provider]  omeprazole (PRILOSEC) 20 MG capsule Take 20 mg by mouth every other day.     [provider]                                                                                                                                     Past Surgical History Past Surgical History:  Procedure Laterality Date   CATARACT EXTRACTION Right 2003   [facility]   CATARACT EXTRACTION W/PHACO Left 04/09/2013   Procedure: LEFT EYE CATARACT EXTRACTION PHACO AND INTRAOCULAR LENS PLACEMENT ;  Surgeon: Gemma Payor, MD;  Location: [facility];  Service: Ophthalmology;  Laterality: Left;  CDE 10.78   MOUTH SURGERY  04/2017   spot removed by Dr. Jeanice Lim    TONSILLECTOMY  [phone]   TUBAL LIGATION  1980   Family History Family History  Problem Relation Age of Onset   Parkinson's disease Father    Diabetes Father     Social History Social History   Tobacco Use   Smoking status: Never   Smokeless tobacco: Never  Vaping Use   Vaping status: Never Used  Substance Use Topics   Alcohol use: No   Drug use: No   Allergies Patient has no known allergies.  Review of Systems Review of Systems  Constitutional:  Positive for chills and fatigue. Negative for appetite change.  Respiratory:  Negative for chest tightness and shortness of breath.   Cardiovascular:  Positive for chest pain. Negative for palpitations.  Gastrointestinal:  Negative for abdominal pain, nausea and vomiting.  Genitourinary:  Positive for frequency. Negative for dysuria.  Musculoskeletal:  Positive for arthralgias.  All other systems reviewed and are negative.   Physical Exam Vital Signs  I have reviewed the triage vital signs BP 114/75   Pulse (!) 111   Temp 99.4 F (37.4 C) (Oral)   Resp 20   Ht 5\' 2"  (1.575 m)   Wt 57.6 kg   SpO2 97%   BMI 23.23 kg/m  Physical Exam Vitals and nursing note reviewed.  Constitutional:      General: She is not in acute distress.    Appearance: Normal appearance.  HENT:     Head: Normocephalic and atraumatic.     Right Ear: External ear normal.     Left Ear: External ear normal.     Nose: Nose normal.     Mouth/Throat:      Mouth: Mucous membranes are moist.  Eyes:     General: No scleral icterus.       Right eye:  No discharge.        Left eye: No discharge.  Cardiovascular:     Rate and Rhythm: Regular rhythm. Tachycardia present.     Pulses: Normal pulses.     Heart sounds: Normal heart sounds.  Pulmonary:     Effort: Pulmonary effort is normal. No respiratory distress.     Breath sounds: Normal breath sounds. No stridor.  Abdominal:     General: Abdomen is flat. There is no distension.     Palpations: Abdomen is soft.     Tenderness: There is no abdominal tenderness.  Musculoskeletal:     Cervical back: No rigidity.     Right lower leg: No edema.     Left lower leg: No edema.  Skin:    General: Skin is warm and dry.     Capillary Refill: Capillary refill takes less than 2 seconds.  Neurological:     Mental Status: She is alert and oriented to person, place, and time.     GCS: GCS eye subscore is 4. GCS verbal subscore is 5. GCS motor subscore is 6.  Psychiatric:        Mood and Affect: Mood normal.        Behavior: Behavior normal. Behavior is cooperative.     ED Results and Treatments Labs (all labs ordered are listed, but only abnormal results are displayed) Labs Reviewed  CBC WITH DIFFERENTIAL/PLATELET - Abnormal; Notable for the following components:      Result Value   WBC 11.4 (*)    RBC 3.58 (*)    Hemoglobin 10.5 (*)    HCT 33.9 (*)    Platelets 401 (*)    Neutro Abs 8.6 (*)    All other components within normal limits  COMPREHENSIVE METABOLIC PANEL - Abnormal; Notable for the following components:   Glucose, Bld 240 (*)    Albumin 2.9 (*)    All other components within normal limits  URINALYSIS, ROUTINE W REFLEX MICROSCOPIC - Abnormal; Notable for the following components:   APPearance HAZY (*)    Protein, ur 30 (*)    Leukocytes,Ua MODERATE (*)    Bacteria, UA FEW (*)    All other components within normal limits  RESP PANEL BY RT-PCR (RSV, FLU A&B, COVID)  RVPGX2   URINE CULTURE  BRAIN NATRIURETIC PEPTIDE  TSH  T4, FREE  TROPONIN I (HIGH SENSITIVITY)  TROPONIN I (HIGH SENSITIVITY)                                                                                                                          Radiology DG Chest 2 View  Result Date: 02/06/2023 CLINICAL DATA:  77 year old female with history of substernal chest pressure. EXAM: CHEST - 2 VIEW COMPARISON:  Chest x-ray 09/27/2016. FINDINGS: Lung volumes are normal. No consolidative airspace disease. No pleural effusions. No pneumothorax. No pulmonary nodule or mass noted. Pulmonary vasculature and the cardiomediastinal silhouette are within normal limits. Atherosclerotic calcifications in the thoracic  aorta. IMPRESSION: 1.  No radiographic evidence of acute cardiopulmonary disease. 2. Aortic atherosclerosis. Electronically Signed   By: Trudie Reed M.D.   On: 02/06/2023 12:23    Pertinent labs & imaging results that were available during my care of the patient were reviewed by me and considered in my medical decision making (see MDM for details).  Medications Ordered in ED Medications  cefTRIAXone (ROCEPHIN) 1 g in sodium chloride 0.9 % 100 mL IVPB (has no administration in time range)  sodium chloride 0.9 % bolus 1,000 mL (1,000 mLs Intravenous New Bag/Given 02/06/23 1406)  iohexol (OMNIPAQUE) 350 MG/ML injection 75 mL (75 mLs Intravenous Contrast Given 02/06/23 1425)                                                                                                                                     Procedures Procedures  (including critical care time)  Medical Decision Making / ED Course    Medical Decision Making:    RAYLINN TRICE is a 77 y.o. female with past medical history as below, significant for DM 2, GERD, HLD, SVT, hypertension, pulmonary nodule who presents to the ED with complaint of chest pain, body aches, night sweats.. The complaint involves an extensive differential  diagnosis and also carries with it a high risk of complications and morbidity.  Serious etiology was considered. Ddx includes but is not limited to: Differential includes all life-threatening causes for chest pain. This includes but is not exclusive to acute coronary syndrome, aortic dissection, pulmonary embolism, cardiac tamponade, community-acquired pneumonia, pericarditis, musculoskeletal chest wall pain, etc.   Complete initial physical exam performed, notably the patient  was tachycardia noted, otherwise no acute distress.  No hypoxia, no fever.    Reviewed and confirmed nursing documentation for past medical history, family history, social history.  Vital signs reviewed.    Clinical Course as of 02/06/23 1532  Sun Feb 06, 2023  1530 WBC(!): 11.4 [SG]  1530 Hemoglobin(!): 10.5 Baseline around 12, denies melena or unusual bleeding  [SG]    Clinical Course User Index [SG] Sloan Leiter, DO     UA concerning for infection, send culture, give Rocephin, keflex for home; reviewed prior culture from 2018   She is breathing comfortably in ambient air.  No hypoxia.  Heart remains mildly elevated, IVF in process.  She is pending CT PE study.  Chest x-ray is reassuring.  Signed out to incoming EDP pending CT and recheck              Additional history obtained: -Additional history obtained from spouse -External records from outside source obtained and reviewed including: Chart review including previous notes, labs, imaging, consultation notes including  Prior imaging, chest CT 3/24 with left lower lobe nodule lung and thyroid nodule 1.6 cm Medications including Mounjaro   Lab Tests: -I ordered, reviewed, and interpreted labs.   The pertinent results include:  Labs Reviewed  CBC WITH DIFFERENTIAL/PLATELET - Abnormal; Notable for the following components:      Result Value   WBC 11.4 (*)    RBC 3.58 (*)    Hemoglobin 10.5 (*)    HCT 33.9 (*)    Platelets 401 (*)     Neutro Abs 8.6 (*)    All other components within normal limits  COMPREHENSIVE METABOLIC PANEL - Abnormal; Notable for the following components:   Glucose, Bld 240 (*)    Albumin 2.9 (*)    All other components within normal limits  URINALYSIS, ROUTINE W REFLEX MICROSCOPIC - Abnormal; Notable for the following components:   APPearance HAZY (*)    Protein, ur 30 (*)    Leukocytes,Ua MODERATE (*)    Bacteria, UA FEW (*)    All other components within normal limits  RESP PANEL BY RT-PCR (RSV, FLU A&B, COVID)  RVPGX2  URINE CULTURE  BRAIN NATRIURETIC PEPTIDE  TSH  T4, FREE  TROPONIN I (HIGH SENSITIVITY)  TROPONIN I (HIGH SENSITIVITY)    Notable for uti  EKG   EKG Interpretation Date/Time:  Sunday February 06 2023 11:19:35 EDT Ventricular Rate:  108 PR Interval:  130 QRS Duration:  74 QT Interval:  336 QTC Calculation: 450 R Axis:   0  Text Interpretation: Sinus tachycardia with Premature atrial complexes Otherwise normal ECG When compared with ECG of 27-Oct-2022 09:59, Premature atrial complexes are now Present no stemi Confirmed by Tanda Rockers ([id number]) on 02/06/2023 11:25:48 AM         Imaging Studies ordered: I ordered imaging studies including CXR CTPE I independently visualized the following imaging with scope of interpretation limited to determining acute life threatening conditions related to emergency care; findings noted above I independently visualized and interpreted imaging. I agree with the radiologist interpretation   Medicines ordered and prescription drug management: Meds ordered this encounter  Medications   sodium chloride 0.9 % bolus 1,000 mL   cefTRIAXone (ROCEPHIN) 1 g in sodium chloride 0.9 % 100 mL IVPB    Order Specific Question:   Antibiotic Indication:    Answer:   UTI   DISCONTD: iohexol (OMNIPAQUE) 350 MG/ML injection 100 mL   iohexol (OMNIPAQUE) 350 MG/ML injection 75 mL   cephALEXin (KEFLEX) 500 MG capsule    Sig: Take 1 capsule (500 mg  total) by mouth 3 (three) times daily for 7 days.    Dispense:  21 capsule    Refill:  0    -I have reviewed the patients home medicines and have made adjustments as needed   Consultations Obtained: na   Cardiac Monitoring: The patient was maintained on a cardiac monitor.  I personally viewed and interpreted the cardiac monitored which showed an underlying rhythm of: sinus tachycardia Continuous pulse oximetry interpreted by myself, 97% on RA.    Social Determinants of Health:  Diagnosis or treatment significantly limited by social determinants of health: lives at home w/ spouse    Reevaluation: After the interventions noted above, I reevaluated the patient and found that they have improved  Co morbidities that complicate the patient evaluation  Past Medical History:  Diagnosis Date   Chest pain 09/16/2010   normal myocardial perfusion study EF=61%   Diabetes mellitus (HCC)    type 2   GERD (gastroesophageal reflux disease)    History of cardiac murmur 02/23/00   2D Echo EF greater than 55%   Hyperlipemia    Obesity    Osteopenia 06/2017   T  score -1.2 FRAX 9% / 1.1%   Palpitation    Seasonal allergies    SVT (supraventricular tachycardia) (HCC)    Systemic hypertension       Dispostion: Disposition decision including need for hospitalization was considered, and patient disposition pending at time of sign out.    Final Clinical Impression(s) / ED Diagnoses Final diagnoses:  Acute cystitis without hematuria        Sloan Leiter, DO 02/06/23 1532

## 2023-02-06 NOTE — ED Provider Triage Note (Signed)
Emergency Medicine Provider Triage Evaluation Note  Brenda Hatfield , a 77 y.o. female  was evaluated in triage.  Pt complains of chest tightness/pressure.  Review of Systems  Positive: Tightness in chest, feels breathing is 'stressed' Negative: Nausea/vomiting, palpitations, cough, fever, congestion  Physical Exam  BP 114/75   Pulse (!) 111   Temp 99.4 F (37.4 C) (Oral)   Resp 20   Ht 5\' 2"  (1.575 m)   Wt 57.6 kg   SpO2 97%   BMI 23.23 kg/m  Gen:   Awake, no distress   Resp:  Normal effort BS clear bilaterally MSK:   Moves extremities without difficulty No edema Other:  Regular, slightly tachy, no murmur  Medical Decision Making  Medically screening exam initiated at 11:35 AM.  Appropriate orders placed.  Loni Beckwith was informed that the remainder of the evaluation will be completed by another provider, this initial triage assessment does not replace that evaluation, and the importance of remaining in the ED until their evaluation is complete.  Symptoms x 3-4 days, getting progressively worse/more frequent. H/O SVT, has not felt palpitations. No vomiting.    Elpidio Anis, PA-C 02/06/23 1137

## 2023-02-10 DIAGNOSIS — Z7984 Long term (current) use of oral hypoglycemic drugs: Secondary | ICD-10-CM | POA: Diagnosis not present

## 2023-02-10 DIAGNOSIS — I10 Essential (primary) hypertension: Secondary | ICD-10-CM | POA: Diagnosis not present

## 2023-02-10 DIAGNOSIS — E118 Type 2 diabetes mellitus with unspecified complications: Secondary | ICD-10-CM | POA: Diagnosis not present

## 2023-02-10 DIAGNOSIS — R5383 Other fatigue: Secondary | ICD-10-CM | POA: Diagnosis not present

## 2023-02-10 DIAGNOSIS — D509 Iron deficiency anemia, unspecified: Secondary | ICD-10-CM | POA: Diagnosis not present

## 2023-02-10 DIAGNOSIS — B9620 Unspecified Escherichia coli [E. coli] as the cause of diseases classified elsewhere: Secondary | ICD-10-CM | POA: Diagnosis not present

## 2023-02-11 ENCOUNTER — Telehealth (HOSPITAL_BASED_OUTPATIENT_CLINIC_OR_DEPARTMENT_OTHER): Payer: Self-pay

## 2023-02-11 LAB — URINE CULTURE: Culture: 30000 — AB

## 2023-02-11 NOTE — Telephone Encounter (Signed)
Post ED Visit - Positive Culture Follow-up  Culture report reviewed by antimicrobial stewardship pharmacist: [facility] Team [x]  [place], [place].D. []  Celedonio Miyamoto, Pharm.D., BCPS AQ-ID []  Garvin Fila, Pharm.D., BCPS []  Georgina Pillion, Pharm.D., BCPS []  [place], [address].D., BCPS, AAHIVP []  Estella Husk, Pharm.D., BCPS, AAHIVP []  Lysle Pearl, PharmD, BCPS []  Phillips Climes, PharmD, BCPS []  Agapito Games, PharmD, BCPS []  Verlan Friends, PharmD []  Mervyn Gay, PharmD, BCPS []  Vinnie Level, PharmD  [facility] Team []  Len Childs, PharmD []  Greer Pickerel, PharmD []  Adalberto Cole, PharmD []  Perlie Gold, Rph []  Lonell Face) Jean Rosenthal, PharmD []  Earl Many, PharmD []  Junita Push, PharmD []  Dorna Leitz, PharmD []  Terrilee Files, PharmD []  Lynann Beaver, PharmD []  Keturah Barre, PharmD []  Loralee Pacas, PharmD []  Bernadene Person, PharmD   Positive urine culture Treated with Cephalexin, organism sensitive to the same and no further patient follow-up is required at this time.  Sandria Senter 02/11/2023, 8:06 AM

## 2023-02-12 ENCOUNTER — Telehealth (HOSPITAL_BASED_OUTPATIENT_CLINIC_OR_DEPARTMENT_OTHER): Payer: Self-pay | Admitting: *Deleted

## 2023-02-12 NOTE — Telephone Encounter (Signed)
Post ED Visit - Positive Culture Follow-up  Culture report reviewed by antimicrobial stewardship pharmacist: [facility] []  Enzo Bi, Pharm.D. []  Celedonio Miyamoto, Pharm.D., BCPS AQ-ID []  Garvin Fila, Pharm.D., BCPS []  Georgina Pillion, Pharm.D., BCPS []  [place], [place].D., BCPS, AAHIVP []  Estella Husk, Pharm.D., BCPS, AAHIVP []  Lysle Pearl, PharmD, BCPS []  Phillips Climes, PharmD, BCPS []  Agapito Games, PharmD, BCPS []  Verlan Friends, PharmD []  Mervyn Gay, PharmD, BCPS [x]  Riccardo Dubin, PharmD  [facility] []  Len Childs, PharmD []  Greer Pickerel, PharmD []  Adalberto Cole, PharmD []  Perlie Gold, Rph []  Lonell Face) Jean Rosenthal, PharmD []  Earl Many, PharmD []  Junita Push, PharmD []  Dorna Leitz, PharmD []  Terrilee Files, PharmD []  Lynann Beaver, PharmD []  Keturah Barre, PharmD []  Loralee Pacas, PharmD []  Bernadene Person, PharmD   Positive urine culture Treated with Cephalexin, organism sensitive to the same and no further patient follow-up is required at this time.  Patsey Berthold 02/12/2023, 12:56 PM

## 2023-02-15 ENCOUNTER — Observation Stay (HOSPITAL_COMMUNITY)
Admission: EM | Admit: 2023-02-15 | Discharge: 2023-02-17 | Disposition: A | Discharge status: Home / Self Care | Payer: PPO | Attending: Internal Medicine | Admitting: Internal Medicine

## 2023-02-15 ENCOUNTER — Other Ambulatory Visit: Payer: Self-pay

## 2023-02-15 DIAGNOSIS — E1110 Type 2 diabetes mellitus with ketoacidosis without coma: Secondary | ICD-10-CM | POA: Diagnosis not present

## 2023-02-15 DIAGNOSIS — R7889 Finding of other specified substances, not normally found in blood: Secondary | ICD-10-CM

## 2023-02-15 DIAGNOSIS — S3210XA Unspecified fracture of sacrum, initial encounter for closed fracture: Secondary | ICD-10-CM | POA: Diagnosis not present

## 2023-02-15 DIAGNOSIS — R739 Hyperglycemia, unspecified: Principal | ICD-10-CM

## 2023-02-15 DIAGNOSIS — E1165 Type 2 diabetes mellitus with hyperglycemia: Secondary | ICD-10-CM | POA: Diagnosis not present

## 2023-02-15 DIAGNOSIS — Z7982 Long term (current) use of aspirin: Secondary | ICD-10-CM | POA: Insufficient documentation

## 2023-02-15 DIAGNOSIS — D72829 Elevated white blood cell count, unspecified: Secondary | ICD-10-CM | POA: Diagnosis not present

## 2023-02-15 DIAGNOSIS — E876 Hypokalemia: Secondary | ICD-10-CM | POA: Insufficient documentation

## 2023-02-15 DIAGNOSIS — G9341 Metabolic encephalopathy: Secondary | ICD-10-CM | POA: Insufficient documentation

## 2023-02-15 DIAGNOSIS — Z1152 Encounter for screening for COVID-19: Secondary | ICD-10-CM | POA: Insufficient documentation

## 2023-02-15 DIAGNOSIS — D75839 Thrombocytosis, unspecified: Secondary | ICD-10-CM | POA: Diagnosis not present

## 2023-02-15 DIAGNOSIS — E7132 Disorders of ketone metabolism: Secondary | ICD-10-CM | POA: Insufficient documentation

## 2023-02-15 DIAGNOSIS — Y92009 Unspecified place in unspecified non-institutional (private) residence as the place of occurrence of the external cause: Secondary | ICD-10-CM

## 2023-02-15 DIAGNOSIS — Z79899 Other long term (current) drug therapy: Secondary | ICD-10-CM | POA: Insufficient documentation

## 2023-02-15 DIAGNOSIS — Z7984 Long term (current) use of oral hypoglycemic drugs: Secondary | ICD-10-CM | POA: Insufficient documentation

## 2023-02-15 DIAGNOSIS — R4182 Altered mental status, unspecified: Secondary | ICD-10-CM | POA: Diagnosis not present

## 2023-02-15 DIAGNOSIS — Y9289 Other specified places as the place of occurrence of the external cause: Secondary | ICD-10-CM | POA: Diagnosis not present

## 2023-02-15 DIAGNOSIS — I10 Essential (primary) hypertension: Secondary | ICD-10-CM | POA: Diagnosis present

## 2023-02-15 DIAGNOSIS — I6523 Occlusion and stenosis of bilateral carotid arteries: Secondary | ICD-10-CM | POA: Diagnosis not present

## 2023-02-15 DIAGNOSIS — Z794 Long term (current) use of insulin: Secondary | ICD-10-CM | POA: Insufficient documentation

## 2023-02-15 DIAGNOSIS — K5900 Constipation, unspecified: Secondary | ICD-10-CM | POA: Diagnosis not present

## 2023-02-15 DIAGNOSIS — K219 Gastro-esophageal reflux disease without esophagitis: Secondary | ICD-10-CM | POA: Diagnosis present

## 2023-02-15 DIAGNOSIS — E782 Mixed hyperlipidemia: Secondary | ICD-10-CM | POA: Diagnosis present

## 2023-02-15 DIAGNOSIS — E1100 Type 2 diabetes mellitus with hyperosmolarity without nonketotic hyperglycemic-hyperosmolar coma (NKHHC): Secondary | ICD-10-CM | POA: Diagnosis present

## 2023-02-15 DIAGNOSIS — W19XXXA Unspecified fall, initial encounter: Secondary | ICD-10-CM | POA: Diagnosis not present

## 2023-02-15 DIAGNOSIS — D649 Anemia, unspecified: Secondary | ICD-10-CM | POA: Insufficient documentation

## 2023-02-15 LAB — CBG MONITORING, ED: Glucose-Capillary: 510 mg/dL (ref 70–99)

## 2023-02-15 NOTE — ED Triage Notes (Addendum)
Pt states she fell tonight landing on her back side, denies hitting head or LOC. Reports she has been unsteady on her feet the last few days. Husband states she has been intermittently confused today. Also states that a family member came back to check her CBG and it head "HIGH". Hx type 2 Diabetes, takes metformin.    CBG 510 in triage.

## 2023-02-15 NOTE — ED Notes (Signed)
ED tech & myself helped pt to restroom for urine specimen. Pt ambulated with assistance from wheelchair to toilet.

## 2023-02-16 ENCOUNTER — Emergency Department (HOSPITAL_COMMUNITY): Payer: PPO

## 2023-02-16 ENCOUNTER — Observation Stay (HOSPITAL_COMMUNITY)
Admit: 2023-02-16 | Discharge: 2023-02-16 | Disposition: A | Discharge status: Home / Self Care | Payer: PPO | Attending: Internal Medicine

## 2023-02-16 ENCOUNTER — Observation Stay (HOSPITAL_COMMUNITY): Payer: PPO

## 2023-02-16 DIAGNOSIS — E782 Mixed hyperlipidemia: Secondary | ICD-10-CM | POA: Diagnosis not present

## 2023-02-16 DIAGNOSIS — D75839 Thrombocytosis, unspecified: Secondary | ICD-10-CM

## 2023-02-16 DIAGNOSIS — E1110 Type 2 diabetes mellitus with ketoacidosis without coma: Secondary | ICD-10-CM | POA: Diagnosis not present

## 2023-02-16 DIAGNOSIS — R569 Unspecified convulsions: Secondary | ICD-10-CM | POA: Diagnosis not present

## 2023-02-16 DIAGNOSIS — S3210XA Unspecified fracture of sacrum, initial encounter for closed fracture: Secondary | ICD-10-CM | POA: Diagnosis not present

## 2023-02-16 DIAGNOSIS — R4182 Altered mental status, unspecified: Secondary | ICD-10-CM | POA: Diagnosis not present

## 2023-02-16 DIAGNOSIS — D72829 Elevated white blood cell count, unspecified: Secondary | ICD-10-CM | POA: Insufficient documentation

## 2023-02-16 DIAGNOSIS — K5900 Constipation, unspecified: Secondary | ICD-10-CM | POA: Diagnosis not present

## 2023-02-16 DIAGNOSIS — I10 Essential (primary) hypertension: Secondary | ICD-10-CM

## 2023-02-16 DIAGNOSIS — Z7982 Long term (current) use of aspirin: Secondary | ICD-10-CM | POA: Diagnosis not present

## 2023-02-16 DIAGNOSIS — E7132 Disorders of ketone metabolism: Secondary | ICD-10-CM | POA: Diagnosis not present

## 2023-02-16 DIAGNOSIS — D649 Anemia, unspecified: Secondary | ICD-10-CM | POA: Diagnosis not present

## 2023-02-16 DIAGNOSIS — Y92009 Unspecified place in unspecified non-institutional (private) residence as the place of occurrence of the external cause: Secondary | ICD-10-CM | POA: Diagnosis not present

## 2023-02-16 DIAGNOSIS — Z794 Long term (current) use of insulin: Secondary | ICD-10-CM | POA: Diagnosis not present

## 2023-02-16 DIAGNOSIS — I6523 Occlusion and stenosis of bilateral carotid arteries: Secondary | ICD-10-CM | POA: Diagnosis not present

## 2023-02-16 DIAGNOSIS — Z7984 Long term (current) use of oral hypoglycemic drugs: Secondary | ICD-10-CM | POA: Diagnosis not present

## 2023-02-16 DIAGNOSIS — E1100 Type 2 diabetes mellitus with hyperosmolarity without nonketotic hyperglycemic-hyperosmolar coma (NKHHC): Secondary | ICD-10-CM | POA: Diagnosis present

## 2023-02-16 DIAGNOSIS — G9341 Metabolic encephalopathy: Secondary | ICD-10-CM | POA: Insufficient documentation

## 2023-02-16 DIAGNOSIS — R739 Hyperglycemia, unspecified: Secondary | ICD-10-CM | POA: Diagnosis not present

## 2023-02-16 DIAGNOSIS — K219 Gastro-esophageal reflux disease without esophagitis: Secondary | ICD-10-CM

## 2023-02-16 DIAGNOSIS — Z79899 Other long term (current) drug therapy: Secondary | ICD-10-CM | POA: Diagnosis not present

## 2023-02-16 DIAGNOSIS — Z1152 Encounter for screening for COVID-19: Secondary | ICD-10-CM | POA: Diagnosis not present

## 2023-02-16 DIAGNOSIS — W19XXXA Unspecified fall, initial encounter: Secondary | ICD-10-CM | POA: Diagnosis not present

## 2023-02-16 LAB — LACTIC ACID, PLASMA: Lactic Acid, Venous: 1.8 mmol/L (ref 0.5–1.9)

## 2023-02-16 LAB — POCT I-STAT, CHEM 8
BUN: 19 mg/dL (ref 8–23)
Calcium, Ion: 1.26 mmol/L (ref 1.15–1.40)
Chloride: 95 mmol/L — ABNORMAL LOW (ref 98–111)
Creatinine, Ser: 0.8 mg/dL (ref 0.44–1.00)
Glucose, Bld: 566 mg/dL (ref 70–99)
HCT: 31 % — ABNORMAL LOW (ref 36.0–46.0)
Hemoglobin: 10.5 g/dL — ABNORMAL LOW (ref 12.0–15.0)
Potassium: 3.7 mmol/L (ref 3.5–5.1)
Sodium: 131 mmol/L — ABNORMAL LOW (ref 135–145)
TCO2: 22 mmol/L (ref 22–32)

## 2023-02-16 LAB — CBG MONITORING, ED
Glucose-Capillary: 429 mg/dL — ABNORMAL HIGH (ref 70–99)
Glucose-Capillary: 341 mg/dL — ABNORMAL HIGH (ref 70–99)
Glucose-Capillary: 355 mg/dL — ABNORMAL HIGH (ref 70–99)
Glucose-Capillary: 299 mg/dL — ABNORMAL HIGH (ref 70–99)
Glucose-Capillary: 274 mg/dL — ABNORMAL HIGH (ref 70–99)

## 2023-02-16 LAB — COMPREHENSIVE METABOLIC PANEL
ALT: 20 U/L (ref 0–44)
AST: 22 U/L (ref 15–41)
Albumin: 2.1 g/dL — ABNORMAL LOW (ref 3.5–5.0)
Alkaline Phosphatase: 226 U/L — ABNORMAL HIGH (ref 38–126)
Anion gap: 12 (ref 5–15)
BUN: 21 mg/dL (ref 8–23)
CO2: 21 mmol/L — ABNORMAL LOW (ref 22–32)
Calcium: 9.4 mg/dL (ref 8.9–10.3)
Chloride: 96 mmol/L — ABNORMAL LOW (ref 98–111)
Creatinine, Ser: 0.9 mg/dL (ref 0.44–1.00)
GFR, Estimated: >60 mL/min (ref >60)
Glucose, Bld: 592 mg/dL (ref 70–99)
Potassium: 3.7 mmol/L (ref 3.5–5.1)
Sodium: 129 mmol/L — ABNORMAL LOW (ref 135–145)
Total Bilirubin: 0.7 mg/dL (ref 0.3–1.2)
Total Protein: 7 g/dL (ref 6.5–8.1)

## 2023-02-16 LAB — PROCALCITONIN
Procalcitonin: 0.34 ng/mL
Procalcitonin: 0.43 ng/mL

## 2023-02-16 LAB — BLOOD GAS, VENOUS
Acid-base deficit: 1.1 mmol/L (ref 0.0–2.0)
Bicarbonate: 23.5 mmol/L (ref 20.0–28.0)
Drawn by: 1528
O2 Saturation: 60.2 %
Patient temperature: 37.1
pCO2, Ven: 38 mm[Hg] — ABNORMAL LOW (ref 44–60)
pH, Ven: 7.4 (ref 7.25–7.43)
pO2, Ven: 32 mm[Hg] (ref 32–45)

## 2023-02-16 LAB — BASIC METABOLIC PANEL
Anion gap: 13 (ref 5–15)
Anion gap: 13 (ref 5–15)
BUN: 20 mg/dL (ref 8–23)
BUN: 18 mg/dL (ref 8–23)
CO2: 21 mmol/L — ABNORMAL LOW (ref 22–32)
CO2: 21 mmol/L — ABNORMAL LOW (ref 22–32)
Calcium: 9.1 mg/dL (ref 8.9–10.3)
Calcium: 9.2 mg/dL (ref 8.9–10.3)
Chloride: 98 mmol/L (ref 98–111)
Chloride: 101 mmol/L (ref 98–111)
Creatinine, Ser: 0.83 mg/dL (ref 0.44–1.00)
Creatinine, Ser: 0.71 mg/dL (ref 0.44–1.00)
GFR, Estimated: >60 mL/min (ref >60)
GFR, Estimated: >60 mL/min (ref >60)
Glucose, Bld: 337 mg/dL — ABNORMAL HIGH (ref 70–99)
Glucose, Bld: 234 mg/dL — ABNORMAL HIGH (ref 70–99)
Potassium: 3.3 mmol/L — ABNORMAL LOW (ref 3.5–5.1)
Potassium: 3.3 mmol/L — ABNORMAL LOW (ref 3.5–5.1)
Sodium: 132 mmol/L — ABNORMAL LOW (ref 135–145)
Sodium: 135 mmol/L (ref 135–145)

## 2023-02-16 LAB — GLUCOSE, CAPILLARY
Glucose-Capillary: 207 mg/dL — ABNORMAL HIGH (ref 70–99)
Glucose-Capillary: 198 mg/dL — ABNORMAL HIGH (ref 70–99)
Glucose-Capillary: 162 mg/dL — ABNORMAL HIGH (ref 70–99)
Glucose-Capillary: 159 mg/dL — ABNORMAL HIGH (ref 70–99)
Glucose-Capillary: 176 mg/dL — ABNORMAL HIGH (ref 70–99)
Glucose-Capillary: 177 mg/dL — ABNORMAL HIGH (ref 70–99)
Glucose-Capillary: 127 mg/dL — ABNORMAL HIGH (ref 70–99)
Glucose-Capillary: 174 mg/dL — ABNORMAL HIGH (ref 70–99)

## 2023-02-16 LAB — URINALYSIS, ROUTINE W REFLEX MICROSCOPIC
Bacteria, UA: NONE SEEN
Bilirubin Urine: NEGATIVE
Glucose, UA: >=500 mg/dL — AB
Hgb urine dipstick: NEGATIVE
Ketones, ur: 5 mg/dL — AB
Leukocytes,Ua: NEGATIVE
Nitrite: NEGATIVE
Protein, ur: 30 mg/dL — AB
Specific Gravity, Urine: 1.025 (ref 1.005–1.030)
pH: 5 (ref 5.0–8.0)

## 2023-02-16 LAB — CBC
HCT: 28.7 % — ABNORMAL LOW (ref 36.0–46.0)
Hemoglobin: 9.3 g/dL — ABNORMAL LOW (ref 12.0–15.0)
MCH: 29.4 pg (ref 26.0–34.0)
MCHC: 32.4 g/dL (ref 30.0–36.0)
MCV: 90.8 fL (ref 80.0–100.0)
Platelets: 514 10*3/uL — ABNORMAL HIGH (ref 150–400)
RBC: 3.16 MIL/uL — ABNORMAL LOW (ref 3.87–5.11)
RDW: 13.3 % (ref 11.5–15.5)
WBC: 19.7 10*3/uL — ABNORMAL HIGH (ref 4.0–10.5)
nRBC: 0 % (ref 0.0–0.2)

## 2023-02-16 LAB — FOLATE: Folate: 10.4 ng/mL (ref >5.9)

## 2023-02-16 LAB — MRSA NEXT GEN BY PCR, NASAL: MRSA by PCR Next Gen: NOT DETECTED

## 2023-02-16 LAB — TSH: TSH: 2.583 u[IU]/mL (ref 0.350–4.500)

## 2023-02-16 LAB — VITAMIN B12: Vitamin B-12: 401 pg/mL (ref 180–914)

## 2023-02-16 LAB — AMMONIA: Ammonia: 15 umol/L (ref 9–35)

## 2023-02-16 LAB — SARS CORONAVIRUS 2 BY RT PCR: SARS Coronavirus 2 by RT PCR: NEGATIVE

## 2023-02-16 LAB — OSMOLALITY: Osmolality: 295 mosm/kg (ref 275–295)

## 2023-02-16 LAB — BETA-HYDROXYBUTYRIC ACID: Beta-Hydroxybutyric Acid: 0.31 mmol/L — ABNORMAL HIGH (ref 0.05–0.27)

## 2023-02-16 LAB — T4, FREE: Free T4: 1.2 ng/dL — ABNORMAL HIGH (ref 0.61–1.12)

## 2023-02-16 MED ORDER — INSULIN ASPART 100 UNIT/ML IJ SOLN: 0.0000 [IU] | Freq: Every day | INTRAMUSCULAR | Status: DC

## 2023-02-16 MED ORDER — POTASSIUM CHLORIDE CRYS ER 20 MEQ PO TBCR
40.0000 meq | EXTENDED_RELEASE_TABLET | Freq: Once | ORAL | Status: DC
  Filled 2023-02-16: qty 2

## 2023-02-16 MED ORDER — FLUOXETINE HCL 20 MG PO CAPS
20.0000 mg | ORAL_CAPSULE | Freq: Every morning | ORAL | Status: DC
  Administered 2023-02-16: 20 mg via ORAL
  Filled 2023-02-16: qty 1

## 2023-02-16 MED ORDER — HEPARIN SODIUM (PORCINE) 5000 UNIT/ML IJ SOLN
5000.0000 [IU] | Freq: Three times a day (TID) | INTRAMUSCULAR | Status: DC
Start: 2023-02-16 — End: 2023-02-17
  Administered 2023-02-16 – 2023-02-17 (×4): 5000 [IU] via SUBCUTANEOUS
  Filled 2023-02-16 (×4): qty 1

## 2023-02-16 MED ORDER — ASPIRIN 81 MG PO CHEW
81.0000 mg | CHEWABLE_TABLET | Freq: Every day | ORAL | Status: DC
  Administered 2023-02-16 – 2023-02-17 (×2): 81 mg via ORAL
  Filled 2023-02-16 (×2): qty 1

## 2023-02-16 MED ORDER — INSULIN REGULAR(HUMAN) IN NACL 100-0.9 UT/100ML-% IV SOLN
INTRAVENOUS | Status: DC
Start: 2023-02-16 — End: 2023-02-17
  Administered 2023-02-16: 9.5 [IU]/h via INTRAVENOUS
  Filled 2023-02-16: qty 100

## 2023-02-16 MED ORDER — DIAZEPAM 5 MG/ML IJ SOLN
2.5000 mg | Freq: Four times a day (QID) | INTRAMUSCULAR | Status: DC | PRN
  Administered 2023-02-16 – 2023-02-17 (×2): 2.5 mg via INTRAVENOUS
  Filled 2023-02-16 (×2): qty 2

## 2023-02-16 MED ORDER — POTASSIUM CHLORIDE 10 MEQ/100ML IV SOLN
10.0000 meq | INTRAVENOUS | Status: AC
Start: 2023-02-16 — End: 2023-02-17
  Administered 2023-02-16 (×2): 10 meq via INTRAVENOUS
  Filled 2023-02-16 (×2): qty 100

## 2023-02-16 MED ORDER — DIAZEPAM 5 MG/ML IJ SOLN
5.0000 mg | Freq: Once | INTRAMUSCULAR | Status: AC
  Administered 2023-02-16: 5 mg via INTRAVENOUS
  Filled 2023-02-16: qty 2

## 2023-02-16 MED ORDER — LOSARTAN POTASSIUM 25 MG PO TABS
25.0000 mg | ORAL_TABLET | Freq: Every day | ORAL | Status: DC
  Administered 2023-02-16: 25 mg via ORAL
  Filled 2023-02-16: qty 1

## 2023-02-16 MED ORDER — MAGNESIUM SULFATE 2 GM/50ML IV SOLN
2.0000 g | Freq: Once | INTRAVENOUS | Status: AC
  Administered 2023-02-16: 2 g via INTRAVENOUS
  Filled 2023-02-16: qty 50

## 2023-02-16 MED ORDER — INSULIN ASPART 100 UNIT/ML IJ SOLN
0.0000 [IU] | Freq: Three times a day (TID) | INTRAMUSCULAR | Status: DC
  Administered 2023-02-16: 2 [IU] via SUBCUTANEOUS
  Administered 2023-02-17: 5 [IU] via SUBCUTANEOUS
  Administered 2023-02-17: 8 [IU] via SUBCUTANEOUS

## 2023-02-16 MED ORDER — SENNA 8.6 MG PO TABS
2.0000 | ORAL_TABLET | Freq: Every day | ORAL | Status: DC
  Administered 2023-02-17: 17.2 mg via ORAL
  Filled 2023-02-16: qty 2

## 2023-02-16 MED ORDER — POLYETHYLENE GLYCOL 3350 17 G PO PACK
17.0000 g | PACK | Freq: Every day | ORAL | Status: DC
  Administered 2023-02-17: 17 g via ORAL
  Filled 2023-02-16: qty 1

## 2023-02-16 MED ORDER — LACTATED RINGERS IV BOLUS
20.0000 mL/kg | Freq: Once | INTRAVENOUS | Status: AC
  Administered 2023-02-16: 1000 mL via INTRAVENOUS

## 2023-02-16 MED ORDER — DEXTROSE 50 % IV SOLN: 0.0000 mL | INTRAVENOUS | Status: DC | PRN

## 2023-02-16 MED ORDER — INSULIN GLARGINE-YFGN 100 UNIT/ML ~~LOC~~ SOLN
12.0000 [IU] | Freq: Every day | SUBCUTANEOUS | Status: DC
  Administered 2023-02-16 – 2023-02-17 (×2): 12 [IU] via SUBCUTANEOUS
  Filled 2023-02-16 (×3): qty 0.12

## 2023-02-16 MED ORDER — LACTATED RINGERS IV SOLN
INTRAVENOUS | Status: AC
Start: 2023-02-16 — End: 2023-02-17

## 2023-02-16 MED ORDER — INSULIN ASPART 100 UNIT/ML IV SOLN
10.0000 [IU] | Freq: Once | INTRAVENOUS | Status: AC
  Administered 2023-02-16: 10 [IU] via INTRAVENOUS

## 2023-02-16 MED ORDER — SODIUM CHLORIDE 0.9 % IV BOLUS
1000.0000 mL | Freq: Once | INTRAVENOUS | Status: AC
  Administered 2023-02-16: 1000 mL via INTRAVENOUS

## 2023-02-16 MED ORDER — POTASSIUM CHLORIDE 10 MEQ/100ML IV SOLN
10.0000 meq | INTRAVENOUS | Status: AC
  Administered 2023-02-16 (×3): 10 meq via INTRAVENOUS
  Filled 2023-02-16 (×3): qty 100

## 2023-02-16 MED ORDER — ACETAMINOPHEN 325 MG PO TABS
650.0000 mg | ORAL_TABLET | Freq: Four times a day (QID) | ORAL | Status: DC | PRN
  Administered 2023-02-17: 650 mg via ORAL
  Filled 2023-02-16: qty 2

## 2023-02-16 MED ORDER — PANTOPRAZOLE SODIUM 40 MG PO TBEC
40.0000 mg | DELAYED_RELEASE_TABLET | Freq: Every day | ORAL | Status: DC
Start: 2023-02-16 — End: 2023-02-17
  Administered 2023-02-16 – 2023-02-17 (×2): 40 mg via ORAL
  Filled 2023-02-16 (×2): qty 1

## 2023-02-16 MED ORDER — CHLORHEXIDINE GLUCONATE CLOTH 2 % EX PADS
6.0000 | MEDICATED_PAD | Freq: Every day | CUTANEOUS | Status: DC
  Administered 2023-02-16 – 2023-02-17 (×2): 6 via TOPICAL

## 2023-02-16 MED ORDER — METOPROLOL SUCCINATE ER 25 MG PO TB24
12.5000 mg | ORAL_TABLET | Freq: Every day | ORAL | Status: DC
  Administered 2023-02-17: 12.5 mg via ORAL
  Filled 2023-02-16 (×2): qty 1

## 2023-02-16 MED ORDER — ATORVASTATIN CALCIUM 40 MG PO TABS
80.0000 mg | ORAL_TABLET | Freq: Every day | ORAL | Status: DC
  Administered 2023-02-16 – 2023-02-17 (×2): 80 mg via ORAL
  Filled 2023-02-16: qty 2
  Filled 2023-02-16: qty 4

## 2023-02-16 MED ORDER — DEXTROSE IN LACTATED RINGERS 5 % IV SOLN
INTRAVENOUS | Status: AC
Start: 2023-02-16 — End: 2023-02-17

## 2023-02-16 NOTE — Assessment & Plan Note (Signed)
-   History is consistent with orthostatic hypotension - Patient does have an S4 nondisplaced fracture from the fall - She did not hit her head - CT head is without intracranial abnormality - Pain control - PT eval and treat - Likely to improve with hydration - Continue to monitor

## 2023-02-16 NOTE — Hospital Course (Addendum)
77 year old female with a history of hypertension, hyperlipidemia, SVT, GERD presenting with altered mental status.  History is supplemented by the patient's daughter at the bedside.  Apparently, the patient has been confused for about 2 to 3 weeks.  Apparently, the patient went to ED 02/06/23 for CP and was diagnosed with a UTI.  02/06/2023 UA showed > 50 WBC, and urine culture grew E. coli.  The patient was started on cephalexin for which she took over 1 week.  However, the patient continued to have intermittent bouts of confusion according to the patient's daughter.  Patient herself denies any other new medications besides cephalexin.  She denies any fevers, chills, chest pain, shortness breath, coughing, hemoptysis, nausea, vomiting, diarrhea, abdominal pain.  She has had some intermittent headaches.  At baseline, the patient is alert and oriented x 3 and able to drive and perform her ADLs without assistance.  The patient denies any difficulty driving or getting lost she states that she still balances her checkbook. However, the patient states that she has been having some generalized weakness for the past 3 weeks.  On the evening of 02/15/2023, the patient went to use the bathroom, and upon getting up from the commode the patient fell.  It is unclear if she lost consciousness.  Apparently, the patient's granddaughter was nearby and helped her enacted by EMS.  Upon further questioning the patient, she gives contradictory information regarding her previous insulin regimen and seems to have poor insight regarding her medications.  The patient's daughter states that the patient is usually not very forthcoming regarding her medical issues.  Daughter stated that she noted the patient to be somewhat confused as well on 02/11/2023, but also stated that another daughter noted the patient to be confused at least 2 weeks prior to this admission. In the ED, the patient was afebrile hemodynamically stable with oxygen  saturation 98% room air.  WBC 19.7, hemoglobin 9.3, platelets 514.  Sodium 129, potassium 3.7, bicarbonate 21, serum creatinine 0.90, serum glucose 592, anion gap 12 VBG showed 7.4/38/32/23.  CT of the brain was negative.  CT of the pelvis showed an acute angulation of the anterior cortex of S4 suspicious for an acute nondisplaced fracture.  Chest x-ray was negative.  The patient was started on IV fluids and IV insulin.  She was transition to subcutaneous insulin.  Her electrolytes were optimized.  MRI of the brain was negative for acute findings.  Additional metabolic workup including folic acid, B12, and TSH, and ammonia, and VBG were unremarkable.  UA was negative for pyuria.  Blood cultures remained negative.  On the morning of 02/17/2023, the patient's mental status significantly improved.  It was felt that the patient may have some underlying mild chronic impairment which was exacerbated by her acute metabolic derangement in the form of hyper osmolar hyper glycemic state.  It was felt that given her reduced neurologic reserve, it may take some time for the patient to return back to her baseline neurologic status.  The patient's family felt comfortable taking the patient home.  Regarding the patient's diabetes mellitus, the patient was started on Lantus.  She was instructed to go home with Lantus 10 units daily and to check her sugars at least twice daily and to keep her glycemic log.  She was told to follow-up with her PCP regarding any adjustments to her glycemic regimen.  She was instructed to restart her Mounjaro.  She was told to hold off on giving herself Lantus if she notices  her blood sugars less than 200 over a 24-hour period time.  Regarding her leukemoid reaction, this was felt to be reactive secondary to the patient's acute medical issues and sacral bone fracture.  Blood cultures were negative.  Lactic acid 1.8.  PCT 0.43.  She remained hemodynamically stable and afebrile.  COVID-19 PCR was  negative.  She was structured to follow-up with her PCP for repeat CBC in about a week.

## 2023-02-16 NOTE — ED Notes (Signed)
Attempted to call report to ICU, receiving RN in another pt's room & will call back.

## 2023-02-16 NOTE — Progress Notes (Incomplete)
Pt going down to MRI. 5 mg Valium IV given due to agitation. Pt's bracelet and ring were given to husband.

## 2023-02-16 NOTE — Inpatient Diabetes Management (Signed)
Inpatient Diabetes Program Recommendations  AACE/ADA: New Consensus Statement on Inpatient Glycemic Control (2015)  Target Ranges:  Prepandial:   less than 140 mg/dL      Peak postprandial:   less than 180 mg/dL (1-2 hours)      Critically ill patients:  140 - 180 mg/dL   Lab Results  Component Value Date   GLUCAP 198 (H) 02/16/2023   HGBA1C 7.4 11/22/2008   Review of Glycemic Control  Diabetes history: DM2 Outpatient Diabetes medications: Mounjaro 5 mg weekly, Metformin 1 gm bid Current orders for Inpatient glycemic control: IV insulin  A1c Pending  Inpatient Diabetes Program Recommendations:   Noted A1c pending. When transitioning from IV insulin, please consider: -Semglee 12 units (0.2 units/kg x 63.5 kg = 12.7 units) and give 2 hrs. Prior to D/C of IV insulin -Novolog 0-9 units q 4 hrs. While NPO  Will follow while inpatient.  Thank you, Billy Fischer. Skylee Baird, RN, MSN, CDCES  Diabetes Coordinator Inpatient Glycemic Control Team Team Pager [phone] (8am-5pm) 02/16/2023 9:52 AM

## 2023-02-16 NOTE — Progress Notes (Addendum)
PROGRESS NOTE  Brenda Hatfield JOA:[id number] DOB: 1946/01/20 DOA: 02/15/2023 PCP: Benita Stabile, MD  Brief History:  77 year old female with a history of hypertension, hyperlipidemia, SVT, GERD presenting with altered mental status.  History is supplemented by the patient's daughter at the bedside.  Apparently, the patient has been confused for about 2 to 3 weeks.  Apparently, the patient went to ED 02/06/23 for CP and was diagnosed with a UTI.  02/06/2023 UA showed > 50 WBC, and urine culture grew E. coli.  The patient was started on cephalexin for which she took over 1 week.  However, the patient continued to have intermittent bouts of confusion according to the patient's daughter.  Patient herself denies any other new medications besides cephalexin.  She denies any fevers, chills, chest pain, shortness breath, coughing, hemoptysis, nausea, vomiting, diarrhea, abdominal pain.  She has had some intermittent headaches.  At baseline, the patient is alert and oriented x 3 and able to drive and perform her ADLs without assistance.  The patient denies any difficulty driving or getting lost she states that she still balances her checkbook. However, the patient states that she has been having some generalized weakness for the past 3 weeks.  On the evening of 02/15/2023, the patient went to use the bathroom, and upon getting up from the commode the patient fell.  It is unclear if she lost consciousness.  Apparently, the patient's granddaughter was nearby and helped her enacted by EMS.  Upon further questioning the patient, she gives contradictory information regarding her previous insulin regimen and seems to have poor insight regarding her medications.  The patient's daughter states that the patient is usually not very forthcoming regarding her medical issues.  Daughter stated that she noted the patient to be somewhat confused as well on 02/11/2023, but also stated that another daughter noted the  patient to be confused at least 2 weeks prior to this admission. In the ED, the patient was afebrile hemodynamically stable with oxygen saturation 98% room air.  WBC 19.7, hemoglobin 9.3, platelets 514.  Sodium 129, potassium 3.7, bicarbonate 21, serum creatinine 0.90, serum glucose 592, anion gap 12 VBG showed 7.4/38/32/23.  CT of the brain was negative.  CT of the pelvis showed an acute angulation of the anterior cortex of S4 suspicious for an acute nondisplaced fracture.  Chest x-ray was negative.   Assessment/Plan: Acute metabolic encephalopathy -Multifactorial including dehydration, hyperglycemic hyperosmolar state, and possible infectious process -The patient remains confused on 02/16/2023 -02/16/2023 UA negative for pyuria -CT brain negative -VBG 7.4/38/22/23 -B12 -Folic acid -Ammonia -TSH -MRI brain  Hyperosmolar hyperglycemic state -patient started on IV insulin with q 1 hour CBG check and q 4 hour BMPs -pt started on aggressive fluid resuscitation -Electrolytes were monitored and repleted -transitioned to [place] insulin once anion gap closed -diet was advanced once anion gap closed -HbA1C--  Essential hypertension -Continue metoprolol succinate -Continue losartan  Mixed hyperlipidemia -Continue statin  GERD -Continue pantoprazole  Leukemoid reaction -Check lactic acid -Check PCT -Blood culture sent 2 sets -Chest x-ray negative  Hypokalemia -replete -check mag  S4 sacral fracture -continue nonoperative care       Family Communication:   daughter updated 10/30  Consultants:  none  Code Status:  FULL   DVT Prophylaxis:  [place] Heparin   Procedures: As Listed in Progress Note Above  Antibiotics: None    Total time spent 50 minutes.  Greater than 50% spent face to  face counseling and coordinating care.    Subjective: Patient denies fevers, chills, headache, chest pain, dyspnea, nausea, vomiting, diarrhea, abdominal pain, dysuria, hematuria,  hematochezia, and melena. Patient complains of pain in her tailbone.  Objective: Vitals:   02/16/23 0900 02/16/23 1000 02/16/23 1004 02/16/23 1100  BP: 120/61 132/61 (!) 118/51 (!) 121/56  Pulse: 89 91 90 91  Resp:      Temp:      TempSrc:      SpO2: 97% 100%  96%  Weight:      Height:        Intake/Output Summary (Last 24 hours) at 02/16/2023 1301 Last data filed at 02/16/2023 0243 Gross per 24 hour  Intake 1001.39 ml  Output --  Net 1001.39 ml   Weight change:  Exam:  General:  Pt is alert, follows commands appropriately, not in acute distress HEENT: No icterus, No thrush, No neck mass, /AT Cardiovascular: RRR, S1/S2, no rubs, no gallops Respiratory: CTA bilaterally, no wheezing, no crackles, no rhonchi Abdomen: Soft/+BS, non tender, non distended, no guarding Extremities: No edema, No lymphangitis, No petechiae, No rashes, no synovitis Neuro:  CN II-XII intact, strength 4/5 in RUE, RLE, strength 4/5 LUE, LLE; sensation intact bilateral; no dysmetria; babinski equivocal    Data Reviewed: I have personally reviewed following labs and imaging studies Basic Metabolic Panel: Recent Labs  Lab 02/16/23 0003 02/16/23 0009 02/16/23 0513 02/16/23 0838  NA 129* 131* 132* 135  K 3.7 3.7 3.3* 3.3*  CL 96* 95* 98 101  CO2 21*  --  21* 21*  GLUCOSE 592* 566* 337* 234*  BUN 21 19 20 18   CREATININE 0.90 0.80 0.83 0.71  CALCIUM 9.4  --  9.1 9.2   Liver Function Tests: Recent Labs  Lab 02/16/23 0003  AST 22  ALT 20  ALKPHOS 226*  BILITOT 0.7  PROT 7.0  ALBUMIN 2.1*   No results for input(s): "LIPASE", "AMYLASE" in the last 168 hours. No results for input(s): "AMMONIA" in the last 168 hours. Coagulation Profile: No results for input(s): "INR", "PROTIME" in the last 168 hours. CBC: Recent Labs  Lab 02/16/23 0003 02/16/23 0009  WBC 19.7*  --   HGB 9.3* 10.5*  HCT 28.7* 31.0*  MCV 90.8  --   PLT 514*  --    Cardiac Enzymes: No results for input(s):  "CKTOTAL", "CKMB", "CKMBINDEX", "TROPONINI" in the last 168 hours. BNP: Invalid input(s): "POCBNP" CBG: Recent Labs  Lab 02/16/23 0836 02/16/23 0943 02/16/23 1035 02/16/23 1135 02/16/23 1237  GLUCAP 207* 198* 162* 159* 176*   HbA1C: No results for input(s): "HGBA1C" in the last 72 hours. Urine analysis:    Component Value Date/Time   COLORURINE YELLOW 02/16/2023 0153   APPEARANCEUR CLEAR 02/16/2023 0153   LABSPEC 1.025 02/16/2023 0153   PHURINE 5.0 02/16/2023 0153   GLUCOSEU >=500 (A) 02/16/2023 0153   HGBUR NEGATIVE 02/16/2023 0153   BILIRUBINUR NEGATIVE 02/16/2023 0153   BILIRUBINUR negative 06/21/2021 0815   KETONESUR 5 (A) 02/16/2023 0153   PROTEINUR 30 (A) 02/16/2023 0153   UROBILINOGEN 0.2 06/21/2021 0815   NITRITE NEGATIVE 02/16/2023 0153   LEUKOCYTESUR NEGATIVE 02/16/2023 0153   Sepsis Labs: @LABRCNTIP (procalcitonin:4,lacticidven:4) ) Recent Results (from the past 240 hour(s))  Urine Culture     Status: Abnormal   Collection Time: 02/06/23  1:50 PM   Specimen: Urine, Clean Catch  Result Value Ref Range Status   Specimen Description   Final    URINE, CLEAN CATCH Performed at [facility]  [facility], [address] [place], [place] [address]    Special Requests   Final    NONE Performed at [facility], [address], [place], [place] [address]    Culture 30,000 COLONIES/mL ESCHERICHIA COLI (A)  Final   Report Status 02/11/2023 FINAL  Final   Organism ID, Bacteria ESCHERICHIA COLI (A)  Final      Susceptibility   Escherichia coli - MIC*    AMPICILLIN >=32 RESISTANT Resistant     CEFAZOLIN <=4 SENSITIVE Sensitive     CEFEPIME <=0.12 SENSITIVE Sensitive     CEFTRIAXONE <=0.25 SENSITIVE Sensitive     CIPROFLOXACIN <=0.25 SENSITIVE Sensitive     GENTAMICIN >=16 RESISTANT Resistant     IMIPENEM <=0.25 SENSITIVE Sensitive     NITROFURANTOIN <=16 SENSITIVE Sensitive     TRIMETH/SULFA >=320 RESISTANT Resistant     AMPICILLIN/SULBACTAM 8 SENSITIVE Sensitive      PIP/TAZO <=4 SENSITIVE Sensitive ug/mL    * 30,000 COLONIES/mL ESCHERICHIA COLI  MRSA Next Gen by PCR, Nasal     Status: None   Collection Time: 02/16/23  8:25 AM   Specimen: Nasal Mucosa; Nasal Swab  Result Value Ref Range Status   MRSA by PCR Next Gen NOT DETECTED NOT DETECTED Final    Comment: (NOTE) The GeneXpert MRSA Assay (FDA approved for NASAL specimens only), is one component of a comprehensive MRSA colonization surveillance program. It is not intended to diagnose MRSA infection nor to guide or monitor treatment for MRSA infections. Test performance is not FDA approved in patients less than 25 years old. Performed at [facility], [address], [place], [place] [address]      Scheduled Meds:  aspirin  81 mg Oral Daily   atorvastatin  80 mg Oral Daily   Chlorhexidine Gluconate Cloth  6 each Topical Q0600   FLUoxetine  20 mg Oral q morning   heparin  5,000 Units Subcutaneous Q8H   insulin aspart  0-15 Units Subcutaneous TID WC   insulin aspart  0-5 Units Subcutaneous QHS   insulin glargine-yfgn  12 Units Subcutaneous Daily   losartan  25 mg Oral Daily   metoprolol succinate  12.5 mg Oral Daily   pantoprazole  40 mg Oral Daily   potassium chloride  40 mEq Oral Once   Continuous Infusions:  dextrose 5% lactated ringers 125 mL/hr at 02/16/23 0842   insulin 9.5 Units/hr (02/16/23 0536)   lactated ringers 125 mL/hr at 02/16/23 5784    Procedures/Studies: CT PELVIS WO CONTRAST  Result Date: 02/16/2023 CLINICAL DATA:  Larey Seat tonight landing on back side.  Tailbone pain. EXAM: CT PELVIS WITHOUT CONTRAST TECHNIQUE: Multidetector CT imaging of the pelvis was performed following the standard protocol without intravenous contrast. RADIATION DOSE REDUCTION: This exam was performed according to the departmental dose-optimization program which includes automated exposure control, adjustment of the mA and/or kV according to patient size and/or use of iterative reconstruction technique.  COMPARISON:  CT 08/28/2003 FINDINGS: Urinary Tract:  No acute abnormality. Bowel:  Large colonic stool burden.  Stool ball in the rectum. Vascular/Lymphatic: Aortic atherosclerotic calcification. No lymphadenopathy. Reproductive:  No acute abnormality. Other:  None. Musculoskeletal: Demineralization. Acute angulation of the anterior cortex of S4 is suspicious for acute nondisplaced fracture. Chronic L5 spondylolysis with grade 1 anterolisthesis of L5. Degenerative changes pubic symphysis, both hips, SI joints and lower lumbar spine. IMPRESSION: 1. Acute angulation of the anterior cortex of S4 is suspicious for acute nondisplaced fracture. 2. Chronic L5 spondylolysis with grade 1 spondylolisthesis. 3.  Constipation. Aortic Atherosclerosis (ICD10-I70.0). Electronically Signed   By: Minerva Fester M.D.   On: 02/16/2023 03:53   DG Chest 2 View  Result Date: 02/16/2023 CLINICAL DATA:  Hyperglycemia EXAM: CHEST - 2 VIEW COMPARISON:  Radiograph 02/06/2023 FINDINGS: Stable cardiomediastinal silhouette. No focal consolidation, pleural effusion, or pneumothorax. No displaced rib fractures. IMPRESSION: No active cardiopulmonary disease. Electronically Signed   By: Minerva Fester M.D.   On: 02/16/2023 03:44   CT Head Wo Contrast  Result Date: 02/16/2023 CLINICAL DATA:  Altered mental status EXAM: CT HEAD WITHOUT CONTRAST TECHNIQUE: Contiguous axial images were obtained from the base of the skull through the vertex without intravenous contrast. RADIATION DOSE REDUCTION: This exam was performed according to the departmental dose-optimization program which includes automated exposure control, adjustment of the mA and/or kV according to patient size and/or use of iterative reconstruction technique. COMPARISON:  09/27/2016 FINDINGS: Brain: No mass,hemorrhage or extra-axial collection. Normal appearance of the parenchyma and CSF spaces. Vascular: Atherosclerotic calcification of the internal carotid arteries at the skull  base. No abnormal hyperdensity of the major intracranial arteries or dural venous sinuses. Skull: The visualized skull base, calvarium and extracranial soft tissues are normal. Sinuses/Orbits: No fluid levels or advanced mucosal thickening of the visualized paranasal sinuses. No mastoid or middle ear effusion. Normal orbits. IMPRESSION: No acute intracranial abnormality. Electronically Signed   By: Deatra Robinson M.D.   On: 02/16/2023 02:19   CT Angio Chest PE W and/or Wo Contrast  Result Date: 02/06/2023 CLINICAL DATA:  Substernal chest pain and shortness of breath. High probability for pulmonary embolism. EXAM: CT ANGIOGRAPHY CHEST WITH CONTRAST TECHNIQUE: Multidetector CT imaging of the chest was performed using the standard protocol during bolus administration of intravenous contrast. Multiplanar CT image reconstructions and MIPs were obtained to evaluate the vascular anatomy. RADIATION DOSE REDUCTION: This exam was performed according to the departmental dose-optimization program which includes automated exposure control, adjustment of the mA and/or kV according to patient size and/or use of iterative reconstruction technique. CONTRAST:  75mL OMNIPAQUE IOHEXOL 350 MG/ML SOLN COMPARISON:  07/05/2022 FINDINGS: Cardiovascular: Satisfactory opacification of pulmonary arteries noted, and no pulmonary emboli identified. No evidence of thoracic aortic dissection or aneurysm. Mediastinum/Nodes: No pathologically enlarged lymph nodes identified. Stable 1.5 cm left thyroid lobe nodule. Lungs/Pleura: No No evidence of pulmonary infiltrate or pleural effusion. Stable 5 mm pulmonary nodule in left lower lobe. No new or enlarging pulmonary nodules or masses identified. Upper abdomen: No acute findings. Stable small hiatal hernia. Stable 1.2 cm splenic artery aneurysm with dense peripheral calcification. Musculoskeletal: No suspicious bone lesions identified. Review of the MIP images confirms the above findings.  IMPRESSION: No evidence of pulmonary embolism or other acute findings. Stable 5 mm left lower lobe pulmonary nodule. No follow-up needed if patient is low-risk. Continued follow-up chest CT is recommended in 6 months if patient is high-risk. This recommendation follows the consensus statement: Guidelines for Management of Incidental Pulmonary Nodules Detected on CT Images: From the Fleischner Society 2017; Radiology 2017; 284:228-243. Stable small hiatal hernia. Stable 1.2 cm calcified splenic artery aneurysm. Stable 1.5 cm left thyroid lobe nodule. See prior thyroid ultrasound report of 07/21/2022. Electronically Signed   By: Danae Orleans M.D.   On: 02/06/2023 16:11   DG Chest 2 View  Result Date: 02/06/2023 CLINICAL DATA:  77 year old female with history of substernal chest pressure. EXAM: CHEST - 2 VIEW COMPARISON:  Chest x-ray 09/27/2016. FINDINGS: Lung volumes are normal. No consolidative airspace disease. No pleural effusions. No pneumothorax.  No pulmonary nodule or mass noted. Pulmonary vasculature and the cardiomediastinal silhouette are within normal limits. Atherosclerotic calcifications in the thoracic aorta. IMPRESSION: 1.  No radiographic evidence of acute cardiopulmonary disease. 2. Aortic atherosclerosis. Electronically Signed   By: Trudie Reed M.D.   On: 02/06/2023 12:23    Brenda Hartshorn, DO  [facility]  If 7PM-7AM, please contact night-coverage [email or web address] Password [email or web address] 02/16/2023, 1:01 PM   LOS: 0 days

## 2023-02-16 NOTE — Assessment & Plan Note (Addendum)
-   Likely related to dehydration and hyperglycemia - Patient had a UTI, and hyperglycemia, which the 2 together probably caused dehydration - Patient reports she has been trying to drink more fluids at home, but she has not had an appetite - Continue fluids here per Endo tool - Her UTI seems to have resolved based on UA today - Patient reports she was on Keflex for 10 days - She is hyperglycemic today - She reports that she was told not to take her Mounjaro while she was on Keflex and not to check her sugars - She presents today with a sugar of almost 600 - This is likely contributing to her confusion - Sundowning is also likely contributing, as patient reports that she gets more confused at night - CT head is without acute intracranial abnormality - Anticipate improvement with hydration and euglycemia

## 2023-02-16 NOTE — H&P (Signed)
History and Physical    Patient: Brenda Hatfield TFT:[id number] DOB: 04-Oct-1945 DOA: 02/15/2023 DOS: the patient was seen and examined on 02/16/2023 PCP: Benita Stabile, MD  Patient coming from: Home  Chief Complaint:  Chief Complaint  Patient presents with   Hyperglycemia   HPI: Brenda Hatfield is a 77 y.o. female with medical history significant of diabetes mellitus type 2, GERD, SVT, hyperlipidemia, hypertension, and more presents the ED with a chief complaint of altered mental status.  Patient reports that she has been feeling incoherent for 3 days.  She reports she is aware that she is incoherent when it is happening.  She reports she staggers around and miss pronounces words.  It seems to be waxing and waning.  Patient reports at this time she feels quite sturdy.  RN at bedside reports she has noticed the intermittent confusion.  There is very subtle confusion during the exam, like the patient repeatedly asking what the IV is or what the cardiac leads are.  Patient reports she has been having tension headaches.  Last 1 was within the last couple days.  She takes ibuprofen for it.  She is also been taking ibuprofen for the pain in her bladder when she had a UTI.  She reports her UTI started 2 weeks ago, she waited some time before she saw a provider.  She was put on 10 days of Keflex and she has 2 pills left.  Patient denies any dysuria or hematuria at this time.  When asked what her glucose has been running at home, she reports she has not been checking it.  She reports she was told not to take her Mounjaro or check her glucose while she is on the Keflex.  She reports her fasting glucose is normally around 75 on the [facility].  Her hemoglobin A1c's have been in the fives and sixes per her report.  An example of her confusion, when asked if she has a fever she said yes, and that her temperature measured 92.  When I told her 72 is not a fever she just laughed.  It is that kind of subtle confusion.   Patient reports that her fall happened in the bathroom.  She just got up from the toilet and she felt surprised per her report.  She later explains that she felt dehydrated, and fell to the ground.  She did not hit her head.  Patient reports that she is more disoriented at night than during the day.  She is not in any pain at the time of my exam, she does not feel incoherent at the time of my exam.  Patient has no other complaints at this time.  Patient does not smoke and does not drink.  Patient would want to be full code, but does not want to be left on long-term life support. Review of Systems: As mentioned in the history of present illness. All other systems reviewed and are negative. Past Medical History:  Diagnosis Date   Chest pain 09/16/2010   normal myocardial perfusion study EF=61%   Diabetes mellitus (HCC)    type 2   GERD (gastroesophageal reflux disease)    History of cardiac murmur 02/23/00   2D Echo EF greater than 55%   Hyperlipemia    Obesity    Osteopenia 06/2017   T score -1.2 FRAX 9% / 1.1%   Palpitation    Seasonal allergies    SVT (supraventricular tachycardia) (HCC)    Systemic hypertension  Past Surgical History:  Procedure Laterality Date   CATARACT EXTRACTION Right 2003   [facility]   CATARACT EXTRACTION W/PHACO Left 04/09/2013   Procedure: LEFT EYE CATARACT EXTRACTION PHACO AND INTRAOCULAR LENS PLACEMENT ;  Surgeon: Gemma Payor, MD;  Location: [facility];  Service: Ophthalmology;  Laterality: Left;  CDE 10.78   MOUTH SURGERY  04/2017   spot removed by Dr. Jeanice Lim    TONSILLECTOMY  [phone]   TUBAL LIGATION  1980   Social History:  reports that she has never smoked. She has never used smokeless tobacco. She reports that she does not drink alcohol and does not use drugs.  No Known Allergies  Family History  Problem Relation Age of Onset   Parkinson's disease Father    Diabetes Father     Prior to Admission medications   Medication Sig Start Date End Date  Taking? Authorizing Provider  aspirin 81 MG tablet Take 81 mg by mouth daily.    [provider]  atorvastatin (LIPITOR) 80 MG tablet Take 80 mg by mouth daily. 03/01/22   [provider]  azelastine (OPTIVAR) 0.05 % ophthalmic solution Apply 1 drop to eye 2 (two) times daily. 03/29/21   [provider]  EPINEPHrine 0.3 mg/0.3 mL IJ SOAJ injection See admin instructions. Patient not taking: Reported on 10/27/2022    [provider]  FLUoxetine (PROZAC) 20 MG capsule Take 20 mg by mouth every morning. 02/20/21   [provider]  glucose 4 GM chewable tablet Chew 0.5 tablets by mouth as needed for low blood sugar. Patient not taking: Reported on 10/27/2022    [provider]  levocetirizine (XYZAL) 5 MG tablet Take 5 mg by mouth every evening.    [provider]  losartan (COZAAR) 25 MG tablet Take 25 mg by mouth daily.    [provider]  metFORMIN (GLUCOPHAGE) 1000 MG tablet Take 1,000 mg by mouth 2 (two) times daily with a meal.    [provider]  metoprolol succinate (TOPROL-XL) 25 MG 24 hr tablet Take 0.5 tablets (12.5 mg total) by mouth daily. Patient not taking: Reported on 12/02/2022 05/30/19   Croitoru, Rachelle Hora, MD  [facility] 5 MG/0.5ML Pen Inject into the skin once a week. 11/13/21   [provider]  Multiple Vitamin (MULTIVITAMIN) capsule Take 1 capsule by mouth daily.    [provider]  omeprazole (PRILOSEC) 20 MG capsule Take 20 mg by mouth every other day.     [provider]    Physical Exam: Vitals:   02/16/23 0115 02/16/23 0427 02/16/23 0433 02/16/23 0552  BP: 117/66 98/60  (!) 116/56  Pulse: 95 95  95  Resp: 20 17  15   Temp:   97.7 F (36.5 C)   TempSrc:   Oral   SpO2: 96% 95%  96%   1.  General: Patient lying supine in bed,  no acute distress   2. Psychiatric: Alert and oriented x 3, mood and behavior normal for situation, pleasant and cooperative with exam   3.  Neurologic: Speech and language are normal, face is symmetric, moves all 4 extremities voluntarily, subtly confused from baseline  4. HEENMT:  Head is atraumatic, normocephalic, pupils reactive to light, neck is supple, trachea is midline, mucous membranes are moist   5. Respiratory : Lungs are clear to auscultation bilaterally without wheezing, rhonchi, rales, no cyanosis, no increase in work of breathing or accessory muscle use   6. Cardiovascular : Heart rate normal, rhythm is regular, murmur  present, rubs or gallops, trace peripheral edema, peripheral pulses palpated   7. Gastrointestinal:  Abdomen is soft, nondistended, nontender to palpation bowel sounds active, no masses or organomegaly palpated   8. Skin:  Skin is warm, dry and intact without rashes, acute lesions, or ulcers on limited exam   9.Musculoskeletal:  No acute deformities or trauma, no asymmetry in tone, trace peripheral edema, peripheral pulses palpated, no tenderness to palpation in the extremities  Data Reviewed: In the ED patient is afebrile, slightly tachycardic, with soft blood pressure down to 98/54 but then up to 117/66 Maintaining oxygen sats on room air pH 7.4, glucose 592, bicarb 21, gap 12 Beta hydroxybutyrate 0.37 Leukocytosis and thrombocytosis CT head is without acute intracranial abnormality CT pelvis shows an S4 nondisplaced fracture Chest x-ray shows no active cardiopulmonary disease Patient was given 10 units of NovoLog and 1 L normal saline This brought down her glucose from 592-429 Admission requested for hyperglycemia and confusion  Assessment and Plan: * Hyperosmolar hyperglycemic state (HHS) (HCC) - Glucose 592, bicarb 21, pH 7.4, gap 12 - Patient has not been checking glucose or taking her diabetic medications at home - Bolus of insulin was given in the ED that corrected glucose to 429 - Patient also had a positive beta hydroxybutyrate 0.31 - Started on Endo tool for HHS - Patient  will need to restart her diabetic medications at discharge - Continue to monitor  Leukocytosis - Leukocytosis of 19.7 with thrombocytosis of 514 - UA is not indicative of UTI - Procalcitonin pending - No other infectious symptoms - Continue to monitor  Fall at home, initial encounter - History is consistent with orthostatic hypotension - Patient does have an S4 nondisplaced fracture from the fall - She did not hit her head - CT head is without intracranial abnormality - Pain control - PT eval and treat - Likely to improve with hydration - Continue to monitor  Acute metabolic encephalopathy - Likely related to dehydration and hyperglycemia - Patient had a UTI, and hyperglycemia, which the 2 together probably caused dehydration - Patient reports she has been trying to drink more fluids at home, but she has not had an appetite - Continue fluids here per Endo tool - Her UTI seems to have resolved based on UA today - Patient reports she was on Keflex for 10 days - She is hyperglycemic today - She reports that she was told not to take her Mounjaro while she was on Keflex and not to check her sugars - She presents today with a sugar of almost 600 - This is likely contributing to her confusion - Sundowning is also likely contributing, as patient reports that she gets more confused at night - CT head is without acute intracranial abnormality - Anticipate improvement with hydration and euglycemia  GERD - Continue PPI  Essential hypertension - Continue Cozaar, metoprolol  Mixed hyperlipidemia - Continue statin      Advance Care Planning:   Code Status: Full Code  Consults: None at this time  Family Communication: Husband at bedside  Severity of Illness: The appropriate patient status for this patient is OBSERVATION. Observation status is judged to be reasonable and necessary in order to provide the required intensity of service to ensure the patient's safety. The patient's  presenting symptoms, physical exam findings, and initial radiographic and laboratory data in the context of their medical condition is felt to place them at decreased risk for further clinical deterioration. Furthermore, it is anticipated that the patient will  be medically stable for discharge from the hospital within 2 midnights of admission.   Author: Lilyan Gilford, DO 02/16/2023 6:00 AM  For on call review [email or web address].

## 2023-02-16 NOTE — Progress Notes (Signed)
EEG complete - results pending 

## 2023-02-16 NOTE — Assessment & Plan Note (Signed)
-   Leukocytosis of 19.7 with thrombocytosis of 514 - UA is not indicative of UTI - Procalcitonin pending - No other infectious symptoms - Continue to monitor

## 2023-02-16 NOTE — Assessment & Plan Note (Signed)
Continue statin. 

## 2023-02-16 NOTE — Plan of Care (Signed)

## 2023-02-16 NOTE — TOC CM/SW Note (Signed)
Transition of Care [facility]) - Inpatient Brief Assessment   Patient Details  Name: Brenda Hatfield MRN: [id number] Date of Birth: 12-16-1945  Transition of Care [facility]) CM/SW Contact:    Villa Herb, LCSWA Phone Number: 02/16/2023, 4:16 PM   Clinical Narrative: Transition of Care Department [facility]) has reviewed patient and no TOC needs have been identified at this time. We will continue to monitor patient advancement through interdisciplinary progression rounds. If new patient transition needs arise, please place a TOC consult.  Transition of Care Asessment: Insurance and Status: Insurance coverage has been reviewed Patient has primary care physician: Yes Home environment has been reviewed: from home Prior level of function:: independent Prior/Current Home Services: No current home services Social Determinants of Health Reivew: SDOH reviewed no interventions necessary Readmission risk has been reviewed: Yes Transition of care needs: no transition of care needs at this time

## 2023-02-16 NOTE — ED Notes (Signed)
Assisted pt to restroom, ambulated with minimum assist.

## 2023-02-16 NOTE — ED Notes (Signed)
Patient transported to CT 

## 2023-02-16 NOTE — Procedures (Signed)
Patient Name: [name]  MRN: [id number]  Epilepsy Attending: Charlsie Quest  Referring Physician/Provider: Catarina Hartshorn, MD  Date: 02/16/2023 Duration: 23.53 mins  Patient history: 77yo female with ams getting eeg to evaluate for seizure  Level of alertness: Awake/ lethargic   AEDs during EEG study: Valium  Technical aspects: This EEG study was done with scalp electrodes positioned according to the 10-20 International system of electrode placement. Electrical activity was reviewed with band pass filter of 1-70Hz , sensitivity of 7 uV/mm, display speed of 28mm/sec with a 60Hz  notched filter applied as appropriate. EEG data were recorded continuously and digitally stored.  Video monitoring was available and reviewed as appropriate.  Description: EEG showed continuous generalized 3 to 5 Hz theta-delta slowing.  Hyperventilation and photic stimulation were not performed.     ABNORMALITY - Continuous slow, generalized  IMPRESSION: This study is suggestive of moderate diffuse encephalopathy. No seizures or epileptiform discharges were seen throughout the recording.  Gianny Killman Annabelle Harman

## 2023-02-16 NOTE — Assessment & Plan Note (Signed)
Continue PPI ?

## 2023-02-16 NOTE — ED Provider Notes (Signed)
[place] EMERGENCY DEPARTMENT AT [facility] Provider Note   CSN: [id number] Arrival date & time: 02/15/23  2310     History  Chief Complaint  Patient presents with   Hyperglycemia    Brenda Hatfield is a 77 y.o. female.  The history is provided by the patient and a relative.  Hyperglycemia She has history of hypertension, type 2 diabetes, hyperlipidemia and was recently seen for urinary tract infection currently taking cephalexin and comes in because of a fall at home, and confusion as well as elevated blood glucose.  Over the last 3 days, she has noted she has been getting progressively more confused.  She has also been unsteady on her feet and fell today landing on her buttocks.  She is complaining of pain in that area, denies hitting her head.  Family member checked her glucose level and noted that it read "high".  She is not on any anticoagulants.  She denies fever or chills over the last 3 days, but states that she had had low-grade fevers prior to that.  She does state that she has not been urinating much and what urine she has been producing has been dark.  She denies dysuria.  She denies abdominal pain, nausea, vomiting.  She has been constipated.   Home Medications Prior to Admission medications   Medication Sig Start Date End Date Taking? Authorizing Provider  aspirin 81 MG tablet Take 81 mg by mouth daily.    [provider]  atorvastatin (LIPITOR) 80 MG tablet Take 80 mg by mouth daily. 03/01/22   [provider]  azelastine (OPTIVAR) 0.05 % ophthalmic solution Apply 1 drop to eye 2 (two) times daily. 03/29/21   [provider]  EPINEPHrine 0.3 mg/0.3 mL IJ SOAJ injection See admin instructions. Patient not taking: Reported on 10/27/2022    [provider]  FLUoxetine (PROZAC) 20 MG capsule Take 20 mg by mouth every morning. 02/20/21   [provider]  glucose 4 GM chewable tablet Chew 0.5 tablets by mouth as needed for  low blood sugar. Patient not taking: Reported on 10/27/2022    [provider]  levocetirizine (XYZAL) 5 MG tablet Take 5 mg by mouth every evening.    [provider]  losartan (COZAAR) 25 MG tablet Take 25 mg by mouth daily.    [provider]  metFORMIN (GLUCOPHAGE) 1000 MG tablet Take 1,000 mg by mouth 2 (two) times daily with a meal.    [provider]  metoprolol succinate (TOPROL-XL) 25 MG 24 hr tablet Take 0.5 tablets (12.5 mg total) by mouth daily. Patient not taking: Reported on 12/02/2022 05/30/19   Croitoru, Rachelle Hora, MD  [facility] 5 MG/0.5ML Pen Inject into the skin once a week. 11/13/21   [provider]  Multiple Vitamin (MULTIVITAMIN) capsule Take 1 capsule by mouth daily.    [provider]  omeprazole (PRILOSEC) 20 MG capsule Take 20 mg by mouth every other day.     [provider]      Allergies    Patient has no known allergies.    Review of Systems   Review of Systems  All other systems reviewed and are negative.   Physical Exam Updated Vital Signs BP 105/66   Pulse (!) 108   Temp 98.7 F (37.1 C) (Oral)   Resp 20   SpO2 99%  Physical Exam Vitals and nursing note reviewed.   77 year old female, resting comfortably and in no acute distress.  Vital signs are significant for slightly elevated heart rate. Oxygen saturation is 99%, which is normal. Head is normocephalic and atraumatic. PERRLA, EOMI. Oropharynx is clear. Neck is nontender and supple without adenopathy or JVD. Back is moderately tender over the lower sacrum/coccyx, no other areas of tenderness identified. Lungs are clear without rales, wheezes, or rhonchi. Chest is nontender. Heart has regular rate and rhythm without murmur. Abdomen is soft, flat, nontender. Extremities have no cyanosis or edema, full range of motion is present. Skin is warm and dry without rash. Neurologic: Mental status is normal, cranial nerves are intact, moves all  extremities equally.  In spite of her complaint of confusion, she is objectively oriented x 4.  ED Results / Procedures / Treatments   Labs (all labs ordered are listed, but only abnormal results are displayed) Labs Reviewed  CBG MONITORING, ED - Abnormal; Notable for the following components:      Result Value   Glucose-Capillary 510 (*)    All other components within normal limits  CBC  URINALYSIS, ROUTINE W REFLEX MICROSCOPIC    EKG None  Radiology No results found.  Procedures Procedures  Cardiac monitor shows sinus tachycardia, per my interpretation.  Medications Ordered in ED Medications - No data to display  ED Course/ Medical Decision Making/ A&P                                 Medical Decision Making Amount and/or Complexity of Data Reviewed Labs: ordered. Radiology: ordered.  Risk OTC drugs. Decision regarding hospitalization.   Hyperglycemia.  Weakness and confusion in the setting of treated UTI.  Hyperglycemia could represent ketoacidosis or hyperosmolar state versus just poorly controlled diabetes.  I have reviewed her past records, and she was seen on 02/06/2019 for and diagnosed with UTI and was treated with cephalexin.  Culture did grow E. coli which was sensitive to cefazolin.  On arrival, CBG is 510 and my interpretation is hyperglycemia.  I have ordered urinalysis, comprehensive metabolic panel, CBC.  I have also ordered chest x-ray to look for occult pneumonia.  I have ordered CT of head to evaluate for occult trauma and causes of confusion.  I have ordered CT of pelvis to evaluate for sacral and coccygeal fracture in the setting of recent fall.  I have ordered IV fluids as initial treatment for her hyperglycemia.  I have also ordered beta hydroxybutyrate and venous blood gas to evaluate for possible ketoacidosis.  CT scan does appear to show fracture of S4, CT of head shows no acute injury.  I have independently viewed the images, and agree with the  radiologist's interpretation.  Chest x-ray shows no active cardiopulmonary disease.  Have independently viewed those images, and agree with the radiologist's interpretation.  I have reviewed her laboratory tests, and my interpretation is hyponatremia felt to be secondary to hyperglycemia, borderline low CO2 with normal anion gap, elevated alkaline phosphatase which is a new finding, hypoalbuminemia which has progressed, severe hyperglycemia, borderline elevated beta hydroxybutyrate concerning for early ketoacidosis, normal pH on venous blood gases arguing against significant ketoacidosis, leukocytosis which is nonspecific but concerning for acute process such as ketoacidosis, normochromic normocytic anemia which has progressed compared with 02/06/2023, thrombocytosis which is likely reactive.  Urinalysis shows no evidence of infection.  I have ordered IV fluids and insulin, and glucose has improved to 337.  I am concerned that this is early ketoacidosis with a mild metabolic acidosis  noted on metabolic panel and slightly elevated beta hydroxybutyrate.  At this point, I do not feel she needs to be on insulin drip but can be treated with fluids and intermittent insulin.  I have discussed case with Dr. Carren Rang of [facility] hospitalist, who agrees to admit the patient.  CRITICAL CARE Performed by: Dione Booze Total critical care time: 50 minutes Critical care time was exclusive of separately billable procedures and treating other patients. Critical care was necessary to treat or prevent imminent or life-threatening deterioration. Critical care was time spent personally by me on the following activities: development of treatment plan with patient and/or surrogate as well as nursing, discussions with consultants, evaluation of patient's response to treatment, examination of patient, obtaining history from patient or surrogate, ordering and performing treatments and interventions, ordering and review of laboratory  studies, ordering and review of radiographic studies, pulse oximetry and re-evaluation of patient's condition.  Final Clinical Impression(s) / ED Diagnoses Final diagnoses:  Hyperglycemia  Elevated beta-hydroxybutyrate  Fall at home, initial encounter  Closed fracture of sacrum, unspecified portion of sacrum, initial encounter (HCC)  Normochromic normocytic anemia  Thrombocytosis    Rx / DC Orders ED Discharge Orders     None         Dione Booze, MD 02/16/23 [phone]

## 2023-02-16 NOTE — Assessment & Plan Note (Signed)
-   Continue Cozaar, metoprolol

## 2023-02-16 NOTE — Assessment & Plan Note (Signed)
-   Glucose 592, bicarb 21, pH 7.4, gap 12 - Patient has not been checking glucose or taking her diabetic medications at home - Bolus of insulin was given in the ED that corrected glucose to 429 - Patient also had a positive beta hydroxybutyrate 0.31 - Started on Endo tool for HHS - Patient will need to restart her diabetic medications at discharge - Continue to monitor

## 2023-02-17 ENCOUNTER — Other Ambulatory Visit (HOSPITAL_COMMUNITY): Payer: Self-pay | Admitting: *Deleted

## 2023-02-17 ENCOUNTER — Ambulatory Visit (HOSPITAL_COMMUNITY): Payer: PPO

## 2023-02-17 DIAGNOSIS — E1100 Type 2 diabetes mellitus with hyperosmolarity without nonketotic hyperglycemic-hyperosmolar coma (NKHHC): Secondary | ICD-10-CM | POA: Diagnosis not present

## 2023-02-17 DIAGNOSIS — I10 Essential (primary) hypertension: Secondary | ICD-10-CM | POA: Diagnosis not present

## 2023-02-17 DIAGNOSIS — G9341 Metabolic encephalopathy: Secondary | ICD-10-CM | POA: Diagnosis not present

## 2023-02-17 DIAGNOSIS — S3210XA Unspecified fracture of sacrum, initial encounter for closed fracture: Secondary | ICD-10-CM | POA: Diagnosis not present

## 2023-02-17 DIAGNOSIS — D75839 Thrombocytosis, unspecified: Secondary | ICD-10-CM | POA: Diagnosis not present

## 2023-02-17 LAB — CBC WITH DIFFERENTIAL/PLATELET
Abs Immature Granulocytes: 0.77 10*3/uL — ABNORMAL HIGH (ref 0.00–0.07)
Basophils Absolute: 0.1 10*3/uL (ref 0.0–0.1)
Basophils Relative: 0 %
Eosinophils Absolute: 0.1 10*3/uL (ref 0.0–0.5)
Eosinophils Relative: 0 %
HCT: 23.4 % — ABNORMAL LOW (ref 36.0–46.0)
Hemoglobin: 7.7 g/dL — ABNORMAL LOW (ref 12.0–15.0)
Immature Granulocytes: 4 %
Lymphocytes Relative: 9 %
Lymphs Abs: 1.7 10*3/uL (ref 0.7–4.0)
MCH: 29.5 pg (ref 26.0–34.0)
MCHC: 32.9 g/dL (ref 30.0–36.0)
MCV: 89.7 fL (ref 80.0–100.0)
Monocytes Absolute: 0.9 10*3/uL (ref 0.1–1.0)
Monocytes Relative: 5 %
Neutro Abs: 16 10*3/uL — ABNORMAL HIGH (ref 1.7–7.7)
Neutrophils Relative %: 82 %
Platelets: 456 10*3/uL — ABNORMAL HIGH (ref 150–400)
RBC: 2.61 MIL/uL — ABNORMAL LOW (ref 3.87–5.11)
RDW: 13.3 % (ref 11.5–15.5)
WBC: 19.5 10*3/uL — ABNORMAL HIGH (ref 4.0–10.5)
nRBC: 0 % (ref 0.0–0.2)

## 2023-02-17 LAB — GLUCOSE, CAPILLARY
Glucose-Capillary: 217 mg/dL — ABNORMAL HIGH (ref 70–99)
Glucose-Capillary: 292 mg/dL — ABNORMAL HIGH (ref 70–99)

## 2023-02-17 LAB — RPR: RPR Ser Ql: NONREACTIVE

## 2023-02-17 LAB — MAGNESIUM: Magnesium: 1.5 mg/dL — ABNORMAL LOW (ref 1.7–2.4)

## 2023-02-17 LAB — HEMOGLOBIN A1C
Hgb A1c MFr Bld: 7.9 % — ABNORMAL HIGH (ref 4.8–5.6)
Mean Plasma Glucose: 180 mg/dL

## 2023-02-17 LAB — BASIC METABOLIC PANEL
Anion gap: 10 (ref 5–15)
BUN: 17 mg/dL (ref 8–23)
CO2: 22 mmol/L (ref 22–32)
Calcium: 8.7 mg/dL — ABNORMAL LOW (ref 8.9–10.3)
Chloride: 103 mmol/L (ref 98–111)
Creatinine, Ser: 0.7 mg/dL (ref 0.44–1.00)
GFR, Estimated: >60 mL/min (ref >60)
Glucose, Bld: 235 mg/dL — ABNORMAL HIGH (ref 70–99)
Potassium: 3.5 mmol/L (ref 3.5–5.1)
Sodium: 135 mmol/L (ref 135–145)

## 2023-02-17 MED ORDER — METOPROLOL SUCCINATE ER 25 MG PO TB24: 25.0000 mg | ORAL_TABLET | Freq: Every day | ORAL | Status: DC

## 2023-02-17 MED ORDER — LACTATED RINGERS IV BOLUS
500.0000 mL | Freq: Once | INTRAVENOUS | Status: AC
  Administered 2023-02-17: 500 mL via INTRAVENOUS

## 2023-02-17 MED ORDER — METOPROLOL SUCCINATE ER 25 MG PO TB24: 25.0000 mg | ORAL_TABLET | Freq: Every day | ORAL | 1 refills | Status: AC

## 2023-02-17 MED ORDER — POLYETHYLENE GLYCOL 3350 17 G PO PACK: 17.0000 g | PACK | Freq: Every day | ORAL | Status: AC

## 2023-02-17 MED ORDER — POTASSIUM CHLORIDE CRYS ER 20 MEQ PO TBCR
40.0000 meq | EXTENDED_RELEASE_TABLET | Freq: Once | ORAL | Status: AC
  Administered 2023-02-17: 40 meq via ORAL
  Filled 2023-02-17: qty 2

## 2023-02-17 MED ORDER — SENNA 8.6 MG PO TABS: 2.0000 | ORAL_TABLET | Freq: Every day | ORAL | Status: AC

## 2023-02-17 MED ORDER — MAGNESIUM SULFATE 2 GM/50ML IV SOLN
2.0000 g | Freq: Once | INTRAVENOUS | Status: AC
  Administered 2023-02-17: 2 g via INTRAVENOUS
  Filled 2023-02-17: qty 50

## 2023-02-17 MED ORDER — BISACODYL 10 MG RE SUPP
10.0000 mg | Freq: Once | RECTAL | Status: AC
  Administered 2023-02-17: 10 mg via RECTAL
  Filled 2023-02-17: qty 1

## 2023-02-17 MED ORDER — INSULIN GLARGINE 100 UNIT/ML SOLOSTAR PEN: 10.0000 [IU] | PEN_INJECTOR | Freq: Every day | SUBCUTANEOUS | 0 refills | Status: AC

## 2023-02-17 MED ORDER — METOPROLOL SUCCINATE ER 25 MG PO TB24
12.5000 mg | ORAL_TABLET | Freq: Once | ORAL | Status: AC
  Administered 2023-02-17: 12.5 mg via ORAL
  Filled 2023-02-17: qty 1

## 2023-02-17 MED ORDER — ULTRA FLO INSULIN PEN NEEDLES 31G X 8 MM MISC: 0 refills | Status: AC

## 2023-02-17 NOTE — Discharge Summary (Addendum)
Physician Discharge Summary   Patient: Brenda Hatfield MRN: [id number] DOB: December 18, 1945  Admit date:     02/15/2023  Discharge date: 02/17/23  Discharge Physician: Onalee Hua Kristain Filo   PCP: Benita Stabile, MD   Recommendations at discharge:   Please follow up with primary care provider within 1-2 weeks  Please repeat BMP and CBC in one week    Hospital Course: 77 year old female with a history of hypertension, hyperlipidemia, SVT, GERD presenting with altered mental status.  History is supplemented by the patient's daughter at the bedside.  Apparently, the patient has been confused for about 2 to 3 weeks.  Apparently, the patient went to ED 02/06/23 for CP and was diagnosed with a UTI.  02/06/2023 UA showed > 50 WBC, and urine culture grew E. coli.  The patient was started on cephalexin for which she took over 1 week.  However, the patient continued to have intermittent bouts of confusion according to the patient's daughter.  Patient herself denies any other new medications besides cephalexin.  She denies any fevers, chills, chest pain, shortness breath, coughing, hemoptysis, nausea, vomiting, diarrhea, abdominal pain.  She has had some intermittent headaches.  At baseline, the patient is alert and oriented x 3 and able to drive and perform her ADLs without assistance.  The patient denies any difficulty driving or getting lost she states that she still balances her checkbook. However, the patient states that she has been having some generalized weakness for the past 3 weeks.  On the evening of 02/15/2023, the patient went to use the bathroom, and upon getting up from the commode the patient fell.  It is unclear if she lost consciousness.  Apparently, the patient's granddaughter was nearby and helped her enacted by EMS.  Upon further questioning the patient, she gives contradictory information regarding her previous insulin regimen and seems to have poor insight regarding her medications.  The patient's  daughter states that the patient is usually not very forthcoming regarding her medical issues.  Daughter stated that she noted the patient to be somewhat confused as well on 02/11/2023, but also stated that another daughter noted the patient to be confused at least 2 weeks prior to this admission. In the ED, the patient was afebrile hemodynamically stable with oxygen saturation 98% room air.  WBC 19.7, hemoglobin 9.3, platelets 514.  Sodium 129, potassium 3.7, bicarbonate 21, serum creatinine 0.90, serum glucose 592, anion gap 12 VBG showed 7.4/38/32/23.  CT of the brain was negative.  CT of the pelvis showed an acute angulation of the anterior cortex of S4 suspicious for an acute nondisplaced fracture.  Chest x-ray was negative.  The patient was started on IV fluids and IV insulin.  She was transition to subcutaneous insulin.  Her electrolytes were optimized.  MRI of the brain was negative for acute findings.  Additional metabolic workup including folic acid, B12, and TSH, and ammonia, and VBG were unremarkable.  UA was negative for pyuria.  Blood cultures remained negative.  On the morning of 02/17/2023, the patient's mental status significantly improved.  It was felt that the patient may have some underlying mild chronic impairment which was exacerbated by her acute metabolic derangement in the form of hyper osmolar hyper glycemic state.  It was felt that given her reduced neurologic reserve, it may take some time for the patient to return back to her baseline neurologic status.  The patient's family felt comfortable taking the patient home.  Regarding the patient's diabetes mellitus, the patient was  started on Lantus.  She was instructed to go home with Lantus 10 units daily and to check her sugars at least twice daily and to keep her glycemic log.  She was told to follow-up with her PCP regarding any adjustments to her glycemic regimen.  She was instructed to restart her Mounjaro.  She was told to hold  off on giving herself Lantus if she notices her blood sugars less than 200 over a 24-hour period time.  Regarding her leukemoid reaction, this was felt to be reactive secondary to the patient's acute medical issues and sacral bone fracture.  Blood cultures were negative.  Lactic acid 1.8.  PCT 0.43.  She remained hemodynamically stable and afebrile.  COVID-19 PCR was negative.  She was structured to follow-up with her PCP for repeat CBC in about a week.  Assessment and Plan:  Acute metabolic encephalopathy -Multifactorial including dehydration, hyperglycemic hyperosmolar state, and possible infectious process -02/17/2023--mental status much improved -02/16/2023 UA negative for pyuria -CT brain negative -VBG 7.4/38/22/23 -B12--401 -Folic acid--10.4 -Ammonia--15 -TSH--2.583 -MRI brain--negative for acute findings -EEG--no seizure   Hyperosmolar hyperglycemic state -patient started on IV insulin with q 1 hour CBG check and q 4 hour BMPs -pt started on aggressive fluid resuscitation -Electrolytes were monitored and repleted -transitioned to [facility] insulin once anion gap closed -diet was advanced once anion gap closed -02/16/2023 HbA1C--7.9 -Patient instructed to restart Mounjaro -DC home with Lantus 10 units daily and to check her CBGs at least twice daily with instructions to hold off on Lantus if CBGs are less than 200 over a 24-hour period   Essential hypertension -Continue metoprolol succinate -Continue losartan   Mixed hyperlipidemia -Continue statin   GERD -Continue pantoprazole   Leukemoid reaction -Check lactic acid--1.8 -Check PCT--0.43 -Blood culture sent 2 sets--negative to date -Chest x-ray negative infiltrates -UA negative for pyuria -Thought to be secondary to stress demargination from acute medical condition and sacral fracture -CT pelvis shows abundant stool,, no bowel obstruction, no urinary tract abnormalities   Hypokalemia/hypomagnesemia -repleted   S4  sacral fracture -continue nonoperative care -CT pelvis as above  Constipation -daily miralax and senna          Consultants: none Procedures performed: none  Disposition: Home Diet recommendation:  Carb modified diet DISCHARGE MEDICATION: Allergies as of 02/17/2023   No Known Allergies      Medication List     STOP taking these medications    cephALEXin 500 MG capsule Commonly known as: KEFLEX   losartan 25 MG tablet Commonly known as: COZAAR       TAKE these medications    aspirin 81 MG tablet Take 81 mg by mouth daily.   atorvastatin 80 MG tablet Commonly known as: LIPITOR Take 80 mg by mouth daily.   azelastine 0.05 % ophthalmic solution Commonly known as: OPTIVAR Apply 1 drop to eye 2 (two) times daily.   EPINEPHrine 0.3 mg/0.3 mL Soaj injection Commonly known as: EPI-PEN See admin instructions.   FLUoxetine 20 MG capsule Commonly known as: PROZAC Take 20 mg by mouth every morning.   glucose 4 GM chewable tablet Chew 0.5 tablets by mouth as needed for low blood sugar.   insulin glargine 100 UNIT/ML Solostar Pen Commonly known as: LANTUS Inject 10 Units into the skin daily.   levocetirizine 5 MG tablet Commonly known as: XYZAL Take 5 mg by mouth every evening.   metFORMIN 1000 MG tablet Commonly known as: GLUCOPHAGE Take 1,000 mg by mouth 2 (two) times daily with  a meal. What changed: Another medication with the same name was removed. Continue taking this medication, and follow the directions you see here.   metoprolol succinate 25 MG 24 hr tablet Commonly known as: TOPROL-XL Take 1 tablet (25 mg total) by mouth daily. Start taking on: February 18, 2023 What changed: how much to take   Mounjaro 5 MG/0.5ML Pen Generic drug: tirzepatide Inject into the skin once a week.   multivitamin capsule Take 1 capsule by mouth daily.   omeprazole 20 MG capsule Commonly known as: PRILOSEC Take 20 mg by mouth every other day.    polyethylene glycol 17 g packet Commonly known as: MIRALAX / GLYCOLAX Take 17 g by mouth daily. Start taking on: February 18, 2023   senna 8.6 MG Tabs tablet Commonly known as: SENOKOT Take 2 tablets (17.2 mg total) by mouth daily. Start taking on: February 18, 2023   Ultra Flo Insulin Pen Needles 31G X 8 MM Misc Generic drug: Insulin Pen Needle Use with insulin pen to dispense insulin as directed        Discharge Exam: Filed Weights   02/16/23 0827  Weight: 63.5 kg   HEENT:  [place]/AT, No thrush, no icterus CV:  RRR, no rub, no S3, no S4 Lung:  bibasilar crackles.  No wheeze Abd:  soft/+BS, NT Ext:  No edema, no lymphangitis, no synovitis, no rash   Condition at discharge: stable  The results of significant diagnostics from this hospitalization (including imaging, microbiology, ancillary and laboratory) are listed below for reference.   Imaging Studies: MR BRAIN WO CONTRAST  Result Date: 02/16/2023 CLINICAL DATA:  Initial evaluation for altered mental status, unknown cause. EXAM: MRI HEAD WITHOUT CONTRAST TECHNIQUE: Multiplanar, multiecho pulse sequences of the brain and surrounding structures were obtained without intravenous contrast. COMPARISON:  CT from earlier the same day. FINDINGS: Brain: Cerebral volume within normal limits for age. No significant cerebral white matter disease. No evidence for acute or subacute ischemia. Gray-white matter digestion maintained. No areas of chronic cortical infarction. No acute intracranial hemorrhage. Single chronic microhemorrhage noted within the right cerebellum, of doubtful significance in isolation. No mass lesion, midline shift or mass effect. No hydrocephalus or extra-axial fluid collection. Pituitary gland suprasellar region within normal limits. Vascular: Major intracranial vascular flow voids are maintained. Skull and upper cervical spine: Craniocervical junction within normal limits. Bone marrow signal intensity normal. No scalp  soft tissue abnormality. Sinuses/Orbits: Prior bilateral ocular lens replacement. Paranasal sinuses are largely clear. No significant mastoid effusion. Other: None. IMPRESSION: Normal brain MRI for age. No acute intracranial abnormality identified. Electronically Signed   By: Rise Mu M.D.   On: 02/16/2023 20:52   EEG adult  Result Date: 02/16/2023 Charlsie Quest, MD     02/16/2023  4:41 PM Patient Name: KOBEE SIKKEMA MRN: [id number] Epilepsy Attending: Charlsie Quest Referring Physician/Provider: Catarina Hartshorn, MD Date: 02/16/2023 Duration: 23.53 mins Patient history: 77yo female with ams getting eeg to evaluate for seizure Level of alertness: Awake/ lethargic AEDs during EEG study: [place] Technical aspects: This EEG study was done with scalp electrodes positioned according to the 10-20 International system of electrode placement. Electrical activity was reviewed with band pass filter of 1-70Hz , sensitivity of 7 uV/mm, display speed of 2mm/sec with a 60Hz  notched filter applied as appropriate. EEG data were recorded continuously and digitally stored.  Video monitoring was available and reviewed as appropriate. Description: EEG showed continuous generalized 3 to 5 Hz theta-delta slowing.  Hyperventilation and photic stimulation were  not performed.   ABNORMALITY - Continuous slow, generalized IMPRESSION: This study is suggestive of moderate diffuse encephalopathy. No seizures or epileptiform discharges were seen throughout the recording. Charlsie Quest   CT PELVIS WO CONTRAST  Result Date: 02/16/2023 CLINICAL DATA:  Larey Seat tonight landing on back side.  Tailbone pain. EXAM: CT PELVIS WITHOUT CONTRAST TECHNIQUE: Multidetector CT imaging of the pelvis was performed following the standard protocol without intravenous contrast. RADIATION DOSE REDUCTION: This exam was performed according to the departmental dose-optimization program which includes automated exposure control, adjustment of the mA  and/or kV according to patient size and/or use of iterative reconstruction technique. COMPARISON:  CT 08/28/2003 FINDINGS: Urinary Tract:  No acute abnormality. Bowel:  Large colonic stool burden.  Stool ball in the rectum. Vascular/Lymphatic: Aortic atherosclerotic calcification. No lymphadenopathy. Reproductive:  No acute abnormality. Other:  None. Musculoskeletal: Demineralization. Acute angulation of the anterior cortex of S4 is suspicious for acute nondisplaced fracture. Chronic L5 spondylolysis with grade 1 anterolisthesis of L5. Degenerative changes pubic symphysis, both hips, SI joints and lower lumbar spine. IMPRESSION: 1. Acute angulation of the anterior cortex of S4 is suspicious for acute nondisplaced fracture. 2. Chronic L5 spondylolysis with grade 1 spondylolisthesis. 3. Constipation. Aortic Atherosclerosis (ICD10-I70.0). Electronically Signed   By: Minerva Fester M.D.   On: 02/16/2023 03:53   DG Chest 2 View  Result Date: 02/16/2023 CLINICAL DATA:  Hyperglycemia EXAM: CHEST - 2 VIEW COMPARISON:  Radiograph 02/06/2023 FINDINGS: Stable cardiomediastinal silhouette. No focal consolidation, pleural effusion, or pneumothorax. No displaced rib fractures. IMPRESSION: No active cardiopulmonary disease. Electronically Signed   By: Minerva Fester M.D.   On: 02/16/2023 03:44   CT Head Wo Contrast  Result Date: 02/16/2023 CLINICAL DATA:  Altered mental status EXAM: CT HEAD WITHOUT CONTRAST TECHNIQUE: Contiguous axial images were obtained from the base of the skull through the vertex without intravenous contrast. RADIATION DOSE REDUCTION: This exam was performed according to the departmental dose-optimization program which includes automated exposure control, adjustment of the mA and/or kV according to patient size and/or use of iterative reconstruction technique. COMPARISON:  09/27/2016 FINDINGS: Brain: No mass,hemorrhage or extra-axial collection. Normal appearance of the parenchyma and CSF spaces.  Vascular: Atherosclerotic calcification of the internal carotid arteries at the skull base. No abnormal hyperdensity of the major intracranial arteries or dural venous sinuses. Skull: The visualized skull base, calvarium and extracranial soft tissues are normal. Sinuses/Orbits: No fluid levels or advanced mucosal thickening of the visualized paranasal sinuses. No mastoid or middle ear effusion. Normal orbits. IMPRESSION: No acute intracranial abnormality. Electronically Signed   By: Deatra Robinson M.D.   On: 02/16/2023 02:19   CT Angio Chest PE W and/or Wo Contrast  Result Date: 02/06/2023 CLINICAL DATA:  Substernal chest pain and shortness of breath. High probability for pulmonary embolism. EXAM: CT ANGIOGRAPHY CHEST WITH CONTRAST TECHNIQUE: Multidetector CT imaging of the chest was performed using the standard protocol during bolus administration of intravenous contrast. Multiplanar CT image reconstructions and MIPs were obtained to evaluate the vascular anatomy. RADIATION DOSE REDUCTION: This exam was performed according to the departmental dose-optimization program which includes automated exposure control, adjustment of the mA and/or kV according to patient size and/or use of iterative reconstruction technique. CONTRAST:  75mL OMNIPAQUE IOHEXOL 350 MG/ML SOLN COMPARISON:  07/05/2022 FINDINGS: Cardiovascular: Satisfactory opacification of pulmonary arteries noted, and no pulmonary emboli identified. No evidence of thoracic aortic dissection or aneurysm. Mediastinum/Nodes: No pathologically enlarged lymph nodes identified. Stable 1.5 cm left thyroid lobe nodule. Lungs/Pleura: No  No evidence of pulmonary infiltrate or pleural effusion. Stable 5 mm pulmonary nodule in left lower lobe. No new or enlarging pulmonary nodules or masses identified. Upper abdomen: No acute findings. Stable small hiatal hernia. Stable 1.2 cm splenic artery aneurysm with dense peripheral calcification. Musculoskeletal: No suspicious  bone lesions identified. Review of the MIP images confirms the above findings. IMPRESSION: No evidence of pulmonary embolism or other acute findings. Stable 5 mm left lower lobe pulmonary nodule. No follow-up needed if patient is low-risk. Continued follow-up chest CT is recommended in 6 months if patient is high-risk. This recommendation follows the consensus statement: Guidelines for Management of Incidental Pulmonary Nodules Detected on CT Images: From the Fleischner Society 2017; Radiology 2017; 284:228-243. Stable small hiatal hernia. Stable 1.2 cm calcified splenic artery aneurysm. Stable 1.5 cm left thyroid lobe nodule. See prior thyroid ultrasound report of 07/21/2022. Electronically Signed   By: Danae Orleans M.D.   On: 02/06/2023 16:11   DG Chest 2 View  Result Date: 02/06/2023 CLINICAL DATA:  77 year old female with history of substernal chest pressure. EXAM: CHEST - 2 VIEW COMPARISON:  Chest x-ray 09/27/2016. FINDINGS: Lung volumes are normal. No consolidative airspace disease. No pleural effusions. No pneumothorax. No pulmonary nodule or mass noted. Pulmonary vasculature and the cardiomediastinal silhouette are within normal limits. Atherosclerotic calcifications in the thoracic aorta. IMPRESSION: 1.  No radiographic evidence of acute cardiopulmonary disease. 2. Aortic atherosclerosis. Electronically Signed   By: Trudie Reed M.D.   On: 02/06/2023 12:23    Microbiology: Results for orders placed or performed during the hospital encounter of 02/15/23  MRSA Next Gen by PCR, Nasal     Status: None   Collection Time: 02/16/23  8:25 AM   Specimen: Nasal Mucosa; Nasal Swab  Result Value Ref Range Status   MRSA by PCR Next Gen NOT DETECTED NOT DETECTED Final    Comment: (NOTE) The GeneXpert MRSA Assay (FDA approved for NASAL specimens only), is one component of a comprehensive MRSA colonization surveillance program. It is not intended to diagnose MRSA infection nor to guide or monitor  treatment for MRSA infections. Test performance is not FDA approved in patients less than 59 years old. Performed at [facility], [address] [place], [place] [address]   Culture, blood (Routine X 2) w Reflex to ID Panel     Status: None (Preliminary result)   Collection Time: 02/16/23  2:14 PM   Specimen: BLOOD  Result Value Ref Range Status   Specimen Description   Final    BLOOD BLOOD RIGHT HAND AEROBIC BOTTLE ONLY Performed at [facility], [address] [place], [place] [address]    Special Requests   Final    BOTTLES DRAWN AEROBIC ONLY Blood Culture adequate volume Performed at [facility], [address], [place], [place] [address]    Culture   Final    NO GROWTH < 24 HOURS Performed at [facility], [address], [place], [place] [address]    Report Status PENDING  Incomplete  Culture, blood (Routine X 2) w Reflex to ID Panel     Status: None (Preliminary result)   Collection Time: 02/16/23  2:14 PM   Specimen: BLOOD  Result Value Ref Range Status   Specimen Description   Final    BLOOD BLOOD LEFT HAND Performed at [facility], [address] [place], [place] [address]    Special Requests   Final    BOTTLES DRAWN AEROBIC AND ANAEROBIC Blood Culture adequate volume  Performed at [facility], [address], [place], [place] [address]    Culture   Final    NO GROWTH < 24 HOURS Performed at [facility], [address] [place], [place] [address]    Report Status PENDING  Incomplete  SARS Coronavirus 2 by RT PCR (hospital order, performed in [facility] hospital lab) *cepheid single result test* Anterior Nasal Swab     Status: None   Collection Time: 02/16/23  6:24 PM   Specimen: Anterior Nasal Swab  Result Value Ref Range Status   SARS Coronavirus 2 by RT PCR NEGATIVE NEGATIVE Final    Comment: (NOTE) SARS-CoV-2 target nucleic acids are NOT DETECTED.  The SARS-CoV-2 RNA is generally detectable in upper and lower respiratory specimens during the  acute phase of infection. The lowest concentration of SARS-CoV-2 viral copies this assay can detect is 250 copies / mL. A negative result does not preclude SARS-CoV-2 infection and should not be used as the sole basis for treatment or other patient management decisions.  A negative result may occur with improper specimen collection / handling, submission of specimen other than nasopharyngeal swab, presence of viral mutation(s) within the areas targeted by this assay, and inadequate number of viral copies (<250 copies / mL). A negative result must be combined with clinical observations, patient history, and epidemiological information.  Fact Sheet for Patients:   RoadLapTop.co.za  Fact Sheet for Healthcare Providers: http://kim-miller.com/  This test is not yet approved or  cleared by the [place] FDA and has been authorized for detection and/or diagnosis of SARS-CoV-2 by FDA under an Emergency Use Authorization (EUA).  This EUA will remain in effect (meaning this test can be used) for the duration of the COVID-19 declaration under Section 564(b)(1) of the Act, 21 U.S.C. section 360bbb-3(b)(1), unless the authorization is terminated or revoked sooner.  Performed at [facility], [address] [place], [place] [address]     Labs: CBC: Recent Labs  Lab 02/16/23 0003 02/16/23 0009 02/17/23 0557  WBC 19.7*  --  19.5*  NEUTROABS  --   --  16.0*  HGB 9.3* 10.5* 7.7*  HCT 28.7* 31.0* 23.4*  MCV 90.8  --  89.7  PLT 514*  --  456*   Basic Metabolic Panel: Recent Labs  Lab 02/16/23 0003 02/16/23 0009 02/16/23 0513 02/16/23 0838 02/17/23 0557  NA 129* 131* 132* 135 135  K 3.7 3.7 3.3* 3.3* 3.5  CL 96* 95* 98 101 103  CO2 21*  --  21* 21* 22  GLUCOSE 592* 566* 337* 234* 235*  BUN 21 19 20 18 17   CREATININE 0.90 0.80 0.83 0.71 0.70  CALCIUM 9.4  --  9.1 9.2 8.7*  MG  --   --   --   --  1.5*   Liver Function  Tests: Recent Labs  Lab 02/16/23 0003  AST 22  ALT 20  ALKPHOS 226*  BILITOT 0.7  PROT 7.0  ALBUMIN 2.1*   CBG: Recent Labs  Lab 02/16/23 1237 02/16/23 1411 02/16/23 1627 02/16/23 2230 02/17/23 0730  GLUCAP 176* 177* 127* 174* 217*    Discharge time spent: greater than 30 minutes.  Signed: Catarina Hartshorn, MD [facility] 02/17/2023

## 2023-02-17 NOTE — Progress Notes (Signed)
*  PRELIMINARY RESULTS* Echocardiogram 2D Echocardiogram was not been performed. Patient discharged.  Stacey Drain 02/17/2023, 1:57 PM

## 2023-02-17 NOTE — Care Management Obs Status (Signed)
MEDICARE OBSERVATION STATUS NOTIFICATION   Patient Details  Name: Brenda Hatfield MRN: [id number] Date of Birth: [birth year]   Medicare Observation Status Notification Given:  Yes    [name] 02/17/2023, 11:20 AM

## 2023-02-21 LAB — CULTURE, BLOOD (ROUTINE X 2)
Culture: NO GROWTH
Culture: NO GROWTH
Special Requests: ADEQUATE
Special Requests: ADEQUATE

## 2023-02-23 DIAGNOSIS — D509 Iron deficiency anemia, unspecified: Secondary | ICD-10-CM | POA: Diagnosis not present

## 2023-02-23 DIAGNOSIS — E041 Nontoxic single thyroid nodule: Secondary | ICD-10-CM | POA: Diagnosis not present

## 2023-02-23 DIAGNOSIS — Z9189 Other specified personal risk factors, not elsewhere classified: Secondary | ICD-10-CM | POA: Diagnosis not present

## 2023-02-23 DIAGNOSIS — E1142 Type 2 diabetes mellitus with diabetic polyneuropathy: Secondary | ICD-10-CM | POA: Diagnosis not present

## 2023-02-23 DIAGNOSIS — F329 Major depressive disorder, single episode, unspecified: Secondary | ICD-10-CM | POA: Diagnosis not present

## 2023-02-23 DIAGNOSIS — E870 Hyperosmolality and hypernatremia: Secondary | ICD-10-CM | POA: Diagnosis not present

## 2023-02-23 DIAGNOSIS — E1169 Type 2 diabetes mellitus with other specified complication: Secondary | ICD-10-CM | POA: Diagnosis not present

## 2023-02-23 DIAGNOSIS — I10 Essential (primary) hypertension: Secondary | ICD-10-CM | POA: Diagnosis not present

## 2023-02-23 DIAGNOSIS — G9341 Metabolic encephalopathy: Secondary | ICD-10-CM | POA: Diagnosis not present

## 2023-02-23 DIAGNOSIS — R911 Solitary pulmonary nodule: Secondary | ICD-10-CM | POA: Diagnosis not present

## 2023-02-23 DIAGNOSIS — S3217XD Type 4 fracture of sacrum, subsequent encounter for fracture with routine healing: Secondary | ICD-10-CM | POA: Diagnosis not present

## 2023-02-23 DIAGNOSIS — E782 Mixed hyperlipidemia: Secondary | ICD-10-CM | POA: Diagnosis not present

## 2023-02-23 DIAGNOSIS — W19XXXD Unspecified fall, subsequent encounter: Secondary | ICD-10-CM | POA: Diagnosis not present

## 2023-02-24 DIAGNOSIS — K219 Gastro-esophageal reflux disease without esophagitis: Secondary | ICD-10-CM | POA: Diagnosis not present

## 2023-02-24 DIAGNOSIS — G9341 Metabolic encephalopathy: Secondary | ICD-10-CM | POA: Diagnosis not present

## 2023-02-24 DIAGNOSIS — E1165 Type 2 diabetes mellitus with hyperglycemia: Secondary | ICD-10-CM | POA: Diagnosis not present

## 2023-02-24 DIAGNOSIS — N390 Urinary tract infection, site not specified: Secondary | ICD-10-CM | POA: Diagnosis not present

## 2023-02-24 DIAGNOSIS — Z6822 Body mass index (BMI) 22.0-22.9, adult: Secondary | ICD-10-CM | POA: Diagnosis not present

## 2023-02-24 DIAGNOSIS — E041 Nontoxic single thyroid nodule: Secondary | ICD-10-CM | POA: Diagnosis not present

## 2023-02-24 DIAGNOSIS — F419 Anxiety disorder, unspecified: Secondary | ICD-10-CM | POA: Diagnosis not present

## 2023-02-24 DIAGNOSIS — E875 Hyperkalemia: Secondary | ICD-10-CM | POA: Diagnosis not present

## 2023-02-24 DIAGNOSIS — D509 Iron deficiency anemia, unspecified: Secondary | ICD-10-CM | POA: Diagnosis not present

## 2023-02-24 DIAGNOSIS — E1169 Type 2 diabetes mellitus with other specified complication: Secondary | ICD-10-CM | POA: Diagnosis not present

## 2023-02-24 DIAGNOSIS — F329 Major depressive disorder, single episode, unspecified: Secondary | ICD-10-CM | POA: Diagnosis not present

## 2023-02-24 DIAGNOSIS — E1142 Type 2 diabetes mellitus with diabetic polyneuropathy: Secondary | ICD-10-CM | POA: Diagnosis not present

## 2023-02-24 DIAGNOSIS — R911 Solitary pulmonary nodule: Secondary | ICD-10-CM | POA: Diagnosis not present

## 2023-02-24 DIAGNOSIS — J302 Other seasonal allergic rhinitis: Secondary | ICD-10-CM | POA: Diagnosis not present

## 2023-02-24 DIAGNOSIS — K5900 Constipation, unspecified: Secondary | ICD-10-CM | POA: Diagnosis not present

## 2023-02-24 DIAGNOSIS — M25562 Pain in left knee: Secondary | ICD-10-CM | POA: Diagnosis not present

## 2023-02-24 DIAGNOSIS — M5441 Lumbago with sciatica, right side: Secondary | ICD-10-CM | POA: Diagnosis not present

## 2023-02-24 DIAGNOSIS — E782 Mixed hyperlipidemia: Secondary | ICD-10-CM | POA: Diagnosis not present

## 2023-02-24 DIAGNOSIS — M25561 Pain in right knee: Secondary | ICD-10-CM | POA: Diagnosis not present

## 2023-02-24 DIAGNOSIS — Z794 Long term (current) use of insulin: Secondary | ICD-10-CM | POA: Diagnosis not present

## 2023-02-24 DIAGNOSIS — I10 Essential (primary) hypertension: Secondary | ICD-10-CM | POA: Diagnosis not present

## 2023-02-24 DIAGNOSIS — E669 Obesity, unspecified: Secondary | ICD-10-CM | POA: Diagnosis not present

## 2023-02-24 DIAGNOSIS — I700 Atherosclerosis of aorta: Secondary | ICD-10-CM | POA: Diagnosis not present

## 2023-03-03 DIAGNOSIS — D509 Iron deficiency anemia, unspecified: Secondary | ICD-10-CM | POA: Diagnosis not present

## 2023-03-03 DIAGNOSIS — G9341 Metabolic encephalopathy: Secondary | ICD-10-CM | POA: Diagnosis not present

## 2023-03-03 DIAGNOSIS — K5900 Constipation, unspecified: Secondary | ICD-10-CM | POA: Diagnosis not present

## 2023-03-03 DIAGNOSIS — S3217XA Type 4 fracture of sacrum, initial encounter for closed fracture: Secondary | ICD-10-CM | POA: Diagnosis not present

## 2023-03-03 DIAGNOSIS — D75839 Thrombocytosis, unspecified: Secondary | ICD-10-CM | POA: Diagnosis not present

## 2023-03-03 DIAGNOSIS — D72829 Elevated white blood cell count, unspecified: Secondary | ICD-10-CM | POA: Diagnosis not present

## 2023-03-03 DIAGNOSIS — I10 Essential (primary) hypertension: Secondary | ICD-10-CM | POA: Diagnosis not present

## 2023-03-03 DIAGNOSIS — E870 Hyperosmolality and hypernatremia: Secondary | ICD-10-CM | POA: Diagnosis not present

## 2023-03-03 DIAGNOSIS — E1169 Type 2 diabetes mellitus with other specified complication: Secondary | ICD-10-CM | POA: Diagnosis not present

## 2023-03-03 DIAGNOSIS — Z20822 Contact with and (suspected) exposure to covid-19: Secondary | ICD-10-CM | POA: Diagnosis not present

## 2023-03-03 DIAGNOSIS — E875 Hyperkalemia: Secondary | ICD-10-CM | POA: Diagnosis not present

## 2023-03-10 DIAGNOSIS — R300 Dysuria: Secondary | ICD-10-CM | POA: Diagnosis not present

## 2023-03-10 DIAGNOSIS — Z794 Long term (current) use of insulin: Secondary | ICD-10-CM | POA: Diagnosis not present

## 2023-03-10 DIAGNOSIS — E118 Type 2 diabetes mellitus with unspecified complications: Secondary | ICD-10-CM | POA: Diagnosis not present

## 2023-03-10 DIAGNOSIS — Z7984 Long term (current) use of oral hypoglycemic drugs: Secondary | ICD-10-CM | POA: Diagnosis not present

## 2023-03-10 DIAGNOSIS — E1169 Type 2 diabetes mellitus with other specified complication: Secondary | ICD-10-CM | POA: Diagnosis not present

## 2023-03-24 DIAGNOSIS — M25562 Pain in left knee: Secondary | ICD-10-CM | POA: Diagnosis not present

## 2023-03-24 DIAGNOSIS — D509 Iron deficiency anemia, unspecified: Secondary | ICD-10-CM | POA: Diagnosis not present

## 2023-03-24 DIAGNOSIS — E1169 Type 2 diabetes mellitus with other specified complication: Secondary | ICD-10-CM | POA: Diagnosis not present

## 2023-03-24 DIAGNOSIS — I10 Essential (primary) hypertension: Secondary | ICD-10-CM | POA: Diagnosis not present

## 2023-03-24 DIAGNOSIS — M25561 Pain in right knee: Secondary | ICD-10-CM | POA: Diagnosis not present

## 2023-03-24 DIAGNOSIS — D75839 Thrombocytosis, unspecified: Secondary | ICD-10-CM | POA: Diagnosis not present

## 2023-03-24 DIAGNOSIS — E1142 Type 2 diabetes mellitus with diabetic polyneuropathy: Secondary | ICD-10-CM | POA: Diagnosis not present

## 2023-03-24 DIAGNOSIS — F331 Major depressive disorder, recurrent, moderate: Secondary | ICD-10-CM | POA: Diagnosis not present

## 2023-03-24 DIAGNOSIS — D72829 Elevated white blood cell count, unspecified: Secondary | ICD-10-CM | POA: Diagnosis not present

## 2023-03-24 DIAGNOSIS — E875 Hyperkalemia: Secondary | ICD-10-CM | POA: Diagnosis not present

## 2023-03-24 DIAGNOSIS — M5441 Lumbago with sciatica, right side: Secondary | ICD-10-CM | POA: Diagnosis not present

## 2023-04-08 DIAGNOSIS — D72829 Elevated white blood cell count, unspecified: Secondary | ICD-10-CM | POA: Diagnosis not present

## 2023-04-08 DIAGNOSIS — D509 Iron deficiency anemia, unspecified: Secondary | ICD-10-CM | POA: Diagnosis not present

## 2023-04-17 IMAGING — CT CT HEART MORP W/ CTA COR W/ SCORE W/ CA W/CM &/OR W/O CM
4 of 7 series · 8 of 20 positions shown, 9 images · IV contrast (APPLIED)
Comparison: None.

Addendum:
CLINICAL DATA: 75F with diabetes, hyperlipidemia, SVT, syncope and
chest discomfort.

EXAM:
Cardiac/Coronary  CT
TECHNIQUE: The patient was scanned on a [employer] scanner.

[Series 6: best syst · axial · 0.39mm/px · z∈[-161,-123]mm · 2 of 288 slices shown, 3 images]
[im 96/288  vessel]
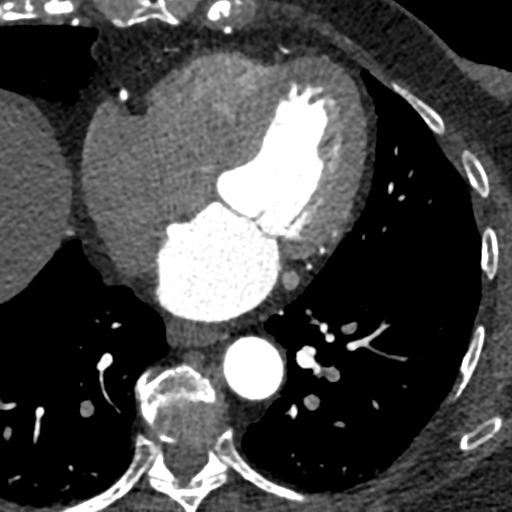
[im 96/288  lung]
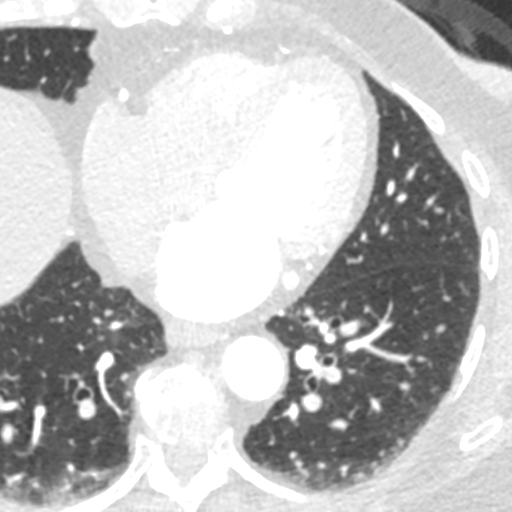
[im 192/288  vessel]
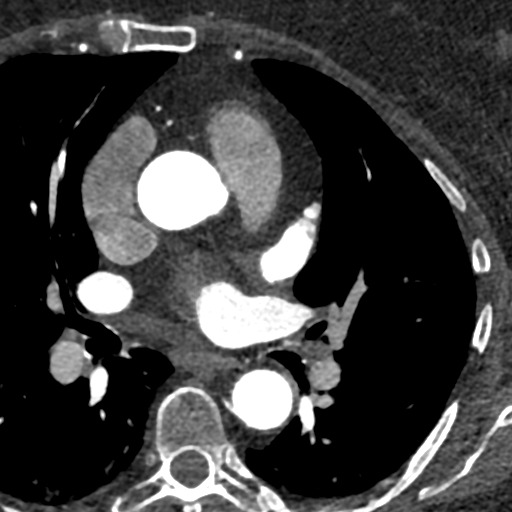

[Series 7: best diast 73 % · axial · 0.39mm/px · z∈[-161,-123]mm · 2 of 288 slices shown]
[im 96/288  vessel]
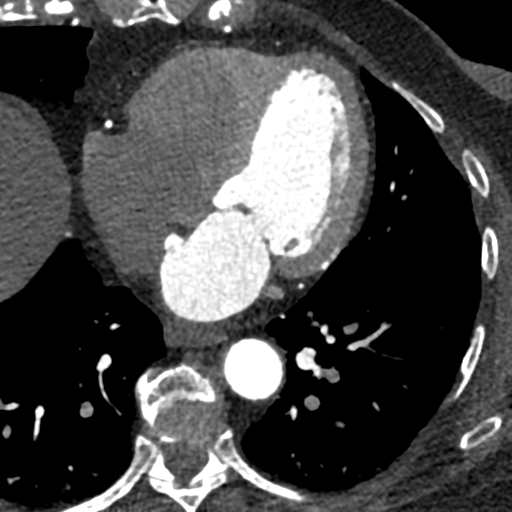
[im 192/288  vessel]
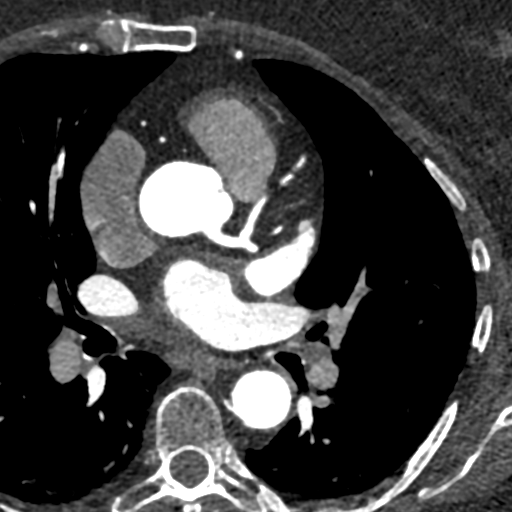

[Series 8: ts diast sharp 73 % · axial · 0.39mm/px · z∈[-161,-123]mm · 2 of 288 slices shown]
[im 96/288  lung]
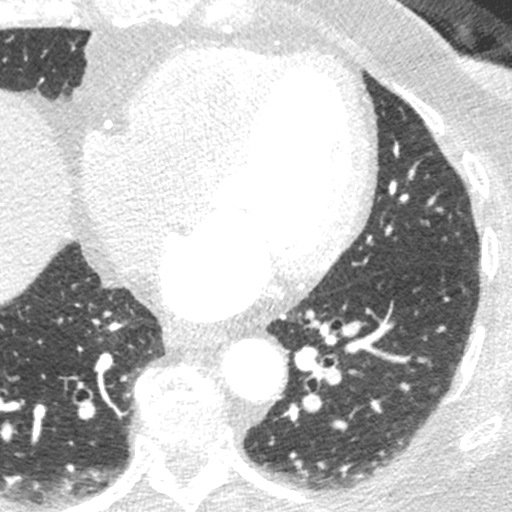
[im 192/288  lung]
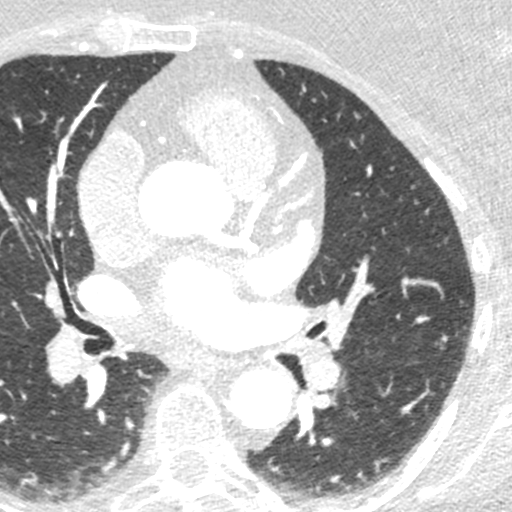

[Series 9: ts syst sharp · axial · 0.39mm/px · z∈[-161,-123]mm · 2 of 288 slices shown]
[im 96/288  lung]
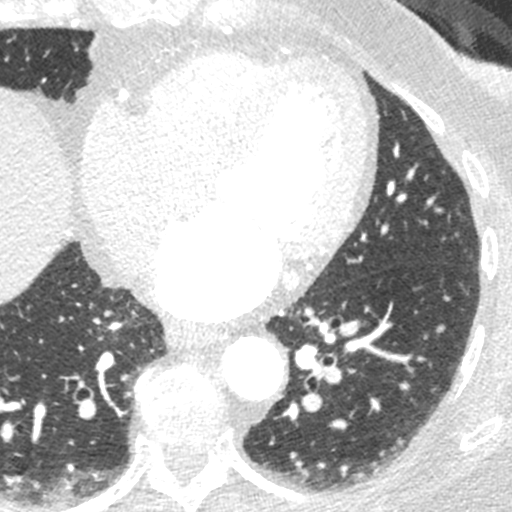
[im 192/288  lung]
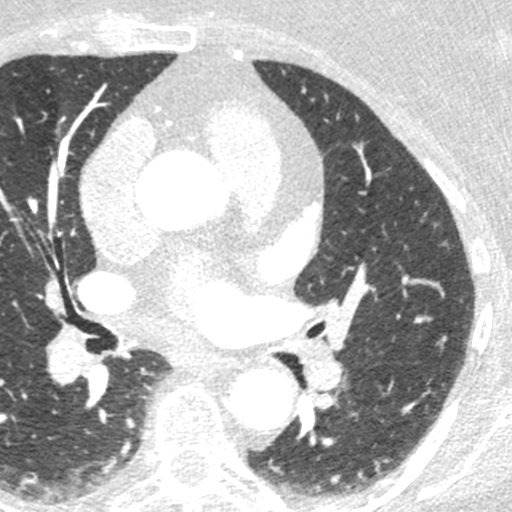

[8 of 20 positions shown; findings below may reference images not displayed]



Aorta: Normal size. Ascending aorta 3.0 cm. Aortic arch and
descending aorta atherosclerosis. No dissection.

Aortic Valve:  Trileaflet.  Mild calcification.

Coronary Arteries:  Normal coronary origin.  Right dominance.

RCA is a large dominant artery that gives rise to PDA and PLVB.
There is no plaque.

Left main is a large artery that gives rise to LAD, RI and LCX
arteries.

LAD is a large vessel that has minimal (<25%) calcified plaque at
the ostium and proximal LAD. Mild (25-49%) calcified plaque in the
mid LAD.

LCX is a non-dominant artery that gives rise to one large OM1
branch. There is no plaque.

Coronary Calcium Score:

Left main: 0

Left anterior descending artery: 229

Left circumflex artery: 0

Right coronary artery: 0

Total: 229

Percentile: 74th

Other findings:

Normal pulmonary vein drainage into the left atrium.

Normal let atrial appendage without a thrombus.

Normal size of the pulmonary artery.

Mitral annular calcification.
IMPRESSION: 1. Coronary calcium score of 229. This was 74th percentile for age-,
race-, and sex-matched controls.

2. Normal coronary origin with right dominance.

3. There is mild (25-49%) calcified plaque in the mid LAD. CAD RADS
2.

4.  Aortic atherosclerosis.

5.  Mild calcification of the aortic valve.

6. Recommend aggressive risk factor modification, including LDL
goal<70.

7. Interpretation of the non-cardiac thoracic structures by
radiology is pending.

ADDENDUM:
OVER-READ INTERPRETATION  CT CHEST

The following report is an over-read performed by radiologist Dr.
over-read does not include interpretation of cardiac or coronary
anatomy or pathology. The coronary CT angiography evaluation and
coronary calcium scoring interpretation by the cardiologist is
attached. Imaging of the chest is focused on cardiac structures and
excludes much of the chest on CT.
FINDINGS: Cardiovascular: Please see dedicated report for cardiovascular
details.

Mediastinum/Nodes: Visualized portions of the chest with no signs of
adenopathy. Esophagus grossly normal.

Lungs/Pleura: Small pulmonary nodule in the LEFT lower lobe 6 x 6 mm
mildly irregular (image 55/11) not seen on remote imaging of the
abdomen and pelvis which includes the lung bases from 9447. No
effusion. No consolidation. Airways are patent.

Upper Abdomen: Upper abdominal structures without acute process on
very limited assessment. Suspect background hepatic steatosis. Also
with small hiatal hernia.

Musculoskeletal: No acute bone finding. No destructive bone process.
Spinal degenerative changes.
IMPRESSION: 6 mm LEFT lower lobe pulmonary nodule. Non-contrast chest CT at 6-12
months is recommended. If the nodule is stable at time of repeat CT,
then future CT at 18-24 months (from today's scan) is considered
optional for low-risk patients, but is recommended for high-risk
patients. This recommendation follows the consensus statement:
Guidelines for Management of Incidental Pulmonary Nodules Detected
[DATE].

These results will be called to the ordering clinician or
representative by the Radiologist Assistant, and communication
documented in the PACS or [REDACTED].

*** End of Addendum ***
FINDINGS: A 120 kV prospective scan was triggered in the descending thoracic
aorta at 111 HU's. Axial non-contrast 3 mm slices were carried out
through the heart. The data set was analyzed on a dedicated work
station and scored using the Agatson method. Gantry rotation speed
was 250 msecs and collimation was .6 mm. No beta blockade and 0.8 mg
of sl NTG was given. The 3D data set was reconstructed in 5%
intervals of the 67-82 % of the R-R cycle. Diastolic phases were
analyzed on a dedicated work station using MPR, MIP and VRT modes.
The patient received 80 cc of contrast.

Aorta: Normal size. Ascending aorta 3.0 cm. Aortic arch and
descending aorta atherosclerosis. No dissection.

Aortic Valve:  Trileaflet.  Mild calcification.

Coronary Arteries:  Normal coronary origin.  Right dominance.

RCA is a large dominant artery that gives rise to PDA and PLVB.
There is no plaque.

Left main is a large artery that gives rise to LAD, RI and LCX
arteries.

LAD is a large vessel that has minimal (<25%) calcified plaque at
the ostium and proximal LAD. Mild (25-49%) calcified plaque in the
mid LAD.

LCX is a non-dominant artery that gives rise to one large OM1
branch. There is no plaque.

Coronary Calcium Score:

Left main: 0

Left anterior descending artery: 229

Left circumflex artery: 0

Right coronary artery: 0

Total: 229

Percentile: 74th

Other findings:

Normal pulmonary vein drainage into the left atrium.

Normal let atrial appendage without a thrombus.

Normal size of the pulmonary artery.

Mitral annular calcification.
IMPRESSION: 1. Coronary calcium score of 229. This was 74th percentile for age-,
race-, and sex-matched controls.

2. Normal coronary origin with right dominance.

3. There is mild (25-49%) calcified plaque in the mid LAD. CAD RADS
2.

4.  Aortic atherosclerosis.

5.  Mild calcification of the aortic valve.

6. Recommend aggressive risk factor modification, including LDL
goal<70.

7. Interpretation of the non-cardiac thoracic structures by
radiology is pending.

## 2023-05-04 DIAGNOSIS — D75839 Thrombocytosis, unspecified: Secondary | ICD-10-CM | POA: Diagnosis not present

## 2023-05-04 DIAGNOSIS — F331 Major depressive disorder, recurrent, moderate: Secondary | ICD-10-CM | POA: Diagnosis not present

## 2023-05-04 DIAGNOSIS — M25562 Pain in left knee: Secondary | ICD-10-CM | POA: Diagnosis not present

## 2023-05-04 DIAGNOSIS — D72829 Elevated white blood cell count, unspecified: Secondary | ICD-10-CM | POA: Diagnosis not present

## 2023-05-04 DIAGNOSIS — E1169 Type 2 diabetes mellitus with other specified complication: Secondary | ICD-10-CM | POA: Diagnosis not present

## 2023-05-04 DIAGNOSIS — E1142 Type 2 diabetes mellitus with diabetic polyneuropathy: Secondary | ICD-10-CM | POA: Diagnosis not present

## 2023-05-04 DIAGNOSIS — E875 Hyperkalemia: Secondary | ICD-10-CM | POA: Diagnosis not present

## 2023-05-04 DIAGNOSIS — M25561 Pain in right knee: Secondary | ICD-10-CM | POA: Diagnosis not present

## 2023-05-04 DIAGNOSIS — E782 Mixed hyperlipidemia: Secondary | ICD-10-CM | POA: Diagnosis not present

## 2023-05-04 DIAGNOSIS — I10 Essential (primary) hypertension: Secondary | ICD-10-CM | POA: Diagnosis not present

## 2023-05-04 DIAGNOSIS — D509 Iron deficiency anemia, unspecified: Secondary | ICD-10-CM | POA: Diagnosis not present

## 2023-05-24 DIAGNOSIS — L570 Actinic keratosis: Secondary | ICD-10-CM | POA: Diagnosis not present

## 2023-05-24 DIAGNOSIS — Z1283 Encounter for screening for malignant neoplasm of skin: Secondary | ICD-10-CM | POA: Diagnosis not present

## 2023-05-24 DIAGNOSIS — D225 Melanocytic nevi of trunk: Secondary | ICD-10-CM | POA: Diagnosis not present

## 2023-05-24 DIAGNOSIS — X32XXXA Exposure to sunlight, initial encounter: Secondary | ICD-10-CM | POA: Diagnosis not present

## 2023-06-02 DIAGNOSIS — D72829 Elevated white blood cell count, unspecified: Secondary | ICD-10-CM | POA: Diagnosis not present

## 2023-06-02 DIAGNOSIS — E1169 Type 2 diabetes mellitus with other specified complication: Secondary | ICD-10-CM | POA: Diagnosis not present

## 2023-06-02 DIAGNOSIS — E782 Mixed hyperlipidemia: Secondary | ICD-10-CM | POA: Diagnosis not present

## 2023-06-02 DIAGNOSIS — D509 Iron deficiency anemia, unspecified: Secondary | ICD-10-CM | POA: Diagnosis not present

## 2023-06-20 DIAGNOSIS — E875 Hyperkalemia: Secondary | ICD-10-CM | POA: Diagnosis not present

## 2023-06-20 DIAGNOSIS — E1169 Type 2 diabetes mellitus with other specified complication: Secondary | ICD-10-CM | POA: Diagnosis not present

## 2023-06-20 DIAGNOSIS — M25562 Pain in left knee: Secondary | ICD-10-CM | POA: Diagnosis not present

## 2023-06-20 DIAGNOSIS — D75839 Thrombocytosis, unspecified: Secondary | ICD-10-CM | POA: Diagnosis not present

## 2023-06-20 DIAGNOSIS — D72829 Elevated white blood cell count, unspecified: Secondary | ICD-10-CM | POA: Diagnosis not present

## 2023-06-20 DIAGNOSIS — E7800 Pure hypercholesterolemia, unspecified: Secondary | ICD-10-CM | POA: Diagnosis not present

## 2023-06-20 DIAGNOSIS — D509 Iron deficiency anemia, unspecified: Secondary | ICD-10-CM | POA: Diagnosis not present

## 2023-06-20 DIAGNOSIS — F331 Major depressive disorder, recurrent, moderate: Secondary | ICD-10-CM | POA: Diagnosis not present

## 2023-06-20 DIAGNOSIS — E782 Mixed hyperlipidemia: Secondary | ICD-10-CM | POA: Diagnosis not present

## 2023-06-20 DIAGNOSIS — M25561 Pain in right knee: Secondary | ICD-10-CM | POA: Diagnosis not present

## 2023-06-20 DIAGNOSIS — I10 Essential (primary) hypertension: Secondary | ICD-10-CM | POA: Diagnosis not present

## 2023-09-16 DIAGNOSIS — J3081 Allergic rhinitis due to animal (cat) (dog) hair and dander: Secondary | ICD-10-CM | POA: Diagnosis not present

## 2023-09-16 DIAGNOSIS — J301 Allergic rhinitis due to pollen: Secondary | ICD-10-CM | POA: Diagnosis not present

## 2023-09-16 DIAGNOSIS — J3089 Other allergic rhinitis: Secondary | ICD-10-CM | POA: Diagnosis not present

## 2023-09-16 DIAGNOSIS — H1045 Other chronic allergic conjunctivitis: Secondary | ICD-10-CM | POA: Diagnosis not present

## 2023-09-23 DIAGNOSIS — M8580 Other specified disorders of bone density and structure, unspecified site: Secondary | ICD-10-CM | POA: Diagnosis not present

## 2023-09-23 DIAGNOSIS — E782 Mixed hyperlipidemia: Secondary | ICD-10-CM | POA: Diagnosis not present

## 2023-09-23 DIAGNOSIS — E1169 Type 2 diabetes mellitus with other specified complication: Secondary | ICD-10-CM | POA: Diagnosis not present

## 2023-09-23 DIAGNOSIS — D72829 Elevated white blood cell count, unspecified: Secondary | ICD-10-CM | POA: Diagnosis not present

## 2023-09-23 DIAGNOSIS — D509 Iron deficiency anemia, unspecified: Secondary | ICD-10-CM | POA: Diagnosis not present

## 2023-09-28 ENCOUNTER — Encounter: Payer: Self-pay | Admitting: Cardiovascular Disease

## 2023-09-28 DIAGNOSIS — I10 Essential (primary) hypertension: Secondary | ICD-10-CM | POA: Diagnosis not present

## 2023-09-28 DIAGNOSIS — F331 Major depressive disorder, recurrent, moderate: Secondary | ICD-10-CM | POA: Diagnosis not present

## 2023-09-28 DIAGNOSIS — E1169 Type 2 diabetes mellitus with other specified complication: Secondary | ICD-10-CM | POA: Diagnosis not present

## 2023-09-28 DIAGNOSIS — E782 Mixed hyperlipidemia: Secondary | ICD-10-CM | POA: Diagnosis not present

## 2023-09-28 DIAGNOSIS — D509 Iron deficiency anemia, unspecified: Secondary | ICD-10-CM | POA: Diagnosis not present

## 2023-09-28 DIAGNOSIS — D75839 Thrombocytosis, unspecified: Secondary | ICD-10-CM | POA: Diagnosis not present

## 2023-09-28 DIAGNOSIS — E1142 Type 2 diabetes mellitus with diabetic polyneuropathy: Secondary | ICD-10-CM | POA: Diagnosis not present

## 2023-09-28 DIAGNOSIS — M25561 Pain in right knee: Secondary | ICD-10-CM | POA: Diagnosis not present

## 2023-09-28 DIAGNOSIS — M25562 Pain in left knee: Secondary | ICD-10-CM | POA: Diagnosis not present

## 2023-09-28 DIAGNOSIS — E875 Hyperkalemia: Secondary | ICD-10-CM | POA: Diagnosis not present

## 2023-09-28 DIAGNOSIS — E7800 Pure hypercholesterolemia, unspecified: Secondary | ICD-10-CM | POA: Diagnosis not present

## 2023-12-01 NOTE — Progress Notes (Deleted)
 78 y.o. G65P0 Married Caucasian female here for a breast and pelvic exam.    The patient is also followed for ***.  PCP: Shona Norleen PEDLAR, MD   No LMP recorded. Patient is postmenopausal.           Sexually active: No.  The current method of family planning is post menopausal status.    Menopausal hormone therapy:  n/a Exercising: {yes no:314532}  {types:19826} Smoker:  no  OB History     Gravida  3   Para      Term      Preterm      AB      Living  3      SAB      IAB      Ectopic      Multiple      Live Births              HEALTH MAINTENANCE: Last 2 paps: 05/22/07 normal, 05/20/06 normal  History of abnormal Pap or positive HPV:  no Mammogram:  11/11/21 [facility] Cat 2 benign  Colonoscopy: [facility] 2021  Bone Density:  12/01/21  Result  low bone mass   Immunization History  Administered Date(s) Administered   Influenza Whole 02/14/2007, 01/05/2008   Influenza, High Dose Seasonal PF 01/12/2019   Zoster, Live 07/06/2007      reports that she has never smoked. She has never used smokeless tobacco. She reports that she does not drink alcohol and does not use drugs.  Past Medical History:  Diagnosis Date   Chest pain 09/16/2010   normal myocardial perfusion study EF=61%   Diabetes mellitus (HCC)    type 2   GERD (gastroesophageal reflux disease)    History of cardiac murmur 02/23/00   2D Echo EF greater than 55%   Hyperlipemia    Obesity    Osteopenia 06/2017   T score -1.2 FRAX 9% / 1.1%   Palpitation    Seasonal allergies    SVT (supraventricular tachycardia) (HCC)    Systemic hypertension     Past Surgical History:  Procedure Laterality Date   CATARACT EXTRACTION Right 2003   [facility]   CATARACT EXTRACTION W/PHACO Left 04/09/2013   Procedure: LEFT EYE CATARACT EXTRACTION PHACO AND INTRAOCULAR LENS PLACEMENT ;  Surgeon: Cherene Mania, MD;  Location: [facility];  Service: Ophthalmology;  Laterality: Left;  CDE 10.78   MOUTH SURGERY  04/2017    spot removed by Dr. Bari    TONSILLECTOMY  [phone]   TUBAL LIGATION  1980    Current Outpatient Medications  Medication Sig Dispense Refill   aspirin 81 MG tablet Take 81 mg by mouth daily.     atorvastatin (LIPITOR) 80 MG tablet Take 80 mg by mouth daily.     azelastine (OPTIVAR) 0.05 % ophthalmic solution Apply 1 drop to eye 2 (two) times daily.     EPINEPHrine 0.3 mg/0.3 mL IJ SOAJ injection See admin instructions.     FLUoxetine (PROZAC) 20 MG capsule Take 20 mg by mouth every morning.     glucose 4 GM chewable tablet Chew 0.5 tablets by mouth as needed for low blood sugar. (Patient not taking: Reported on 10/27/2022)     insulin glargine (LANTUS) 100 UNIT/ML Solostar Pen Inject 10 Units into the skin daily. 15 mL 0   Insulin Pen Needle (ULTRA FLO INSULIN PEN NEEDLES) 31G X 8 MM MISC Use with insulin pen to dispense insulin as directed 100 each 0   levocetirizine (XYZAL)  5 MG tablet Take 5 mg by mouth every evening.     metFORMIN (GLUCOPHAGE) 1000 MG tablet Take 1,000 mg by mouth 2 (two) times daily with a meal.     metoprolol succinate (TOPROL-XL) 25 MG 24 hr tablet Take 1 tablet (25 mg total) by mouth daily. 30 tablet 1   MOUNJARO 5 MG/0.5ML Pen Inject into the skin once a week.     Multiple Vitamin (MULTIVITAMIN) capsule Take 1 capsule by mouth daily.     omeprazole (PRILOSEC) 20 MG capsule Take 20 mg by mouth every other day.      polyethylene glycol (MIRALAX / GLYCOLAX) 17 g packet Take 17 g by mouth daily.     senna (SENOKOT) 8.6 MG TABS tablet Take 2 tablets (17.2 mg total) by mouth daily.     No current facility-administered medications for this visit.    ALLERGIES: Patient has no known allergies.  Family History  Problem Relation Age of Onset   Parkinson's disease Father    Diabetes Father     Review of Systems  PHYSICAL EXAM:  There were no vitals taken for this visit.    General appearance: alert, cooperative and appears stated age Head: normocephalic, without  obvious abnormality, atraumatic Neck: no adenopathy, supple, symmetrical, trachea midline and thyroid normal to inspection and palpation Lungs: clear to auscultation bilaterally Breasts: normal appearance, no masses or tenderness, No nipple retraction or dimpling, No nipple discharge or bleeding, No axillary adenopathy Heart: regular rate and rhythm Abdomen: soft, non-tender; no masses, no organomegaly Extremities: extremities normal, atraumatic, no cyanosis or edema Skin: skin color, texture, turgor normal. No rashes or lesions Lymph nodes: cervical, supraclavicular, and axillary nodes normal. Neurologic: grossly normal  Pelvic: External genitalia:  no lesions              No abnormal inguinal nodes palpated.              Urethra:  normal appearing urethra with no masses, tenderness or lesions              Bartholins and Skenes: normal                 Vagina: normal appearing vagina with normal color and discharge, no lesions              Cervix: no lesions              Pap taken: {yes no:314532} Bimanual Exam:  Uterus:  normal size, contour, position, consistency, mobility, non-tender              Adnexa: no mass, fullness, tenderness              Rectal exam: {yes no:314532}.  Confirms.              Anus:  normal sphincter tone, no lesions  Chaperone was present for exam:  {BSCHAPERONE:31226::Emily F, CMA}  ASSESSMENT: Encounter for breast and pelvic exam.   ***  PLAN: Mammogram screening discussed. Self breast awareness reviewed. Pap and HRV collected:  {yes no:314532} Guidelines for Calcium, Vitamin D, regular exercise program including cardiovascular and weight bearing exercise. Medication refills:  *** {LABS (Optional):23779} Follow up:  ***    Additional counseling given.  {yes [id number]. ***  total time was spent for this patient encounter, including preparation, face-to-face counseling with the patient, coordination of care, and documentation of the encounter in  addition to doing the breast and pelvic exam.

## 2023-12-05 ENCOUNTER — Encounter: Payer: PPO | Admitting: Obstetrics and Gynecology

## 2024-01-02 DIAGNOSIS — Z1231 Encounter for screening mammogram for malignant neoplasm of breast: Secondary | ICD-10-CM | POA: Diagnosis not present

## 2024-01-16 DIAGNOSIS — D509 Iron deficiency anemia, unspecified: Secondary | ICD-10-CM | POA: Diagnosis not present

## 2024-01-16 DIAGNOSIS — E782 Mixed hyperlipidemia: Secondary | ICD-10-CM | POA: Diagnosis not present

## 2024-01-16 DIAGNOSIS — E1169 Type 2 diabetes mellitus with other specified complication: Secondary | ICD-10-CM | POA: Diagnosis not present

## 2024-01-16 DIAGNOSIS — M8580 Other specified disorders of bone density and structure, unspecified site: Secondary | ICD-10-CM | POA: Diagnosis not present

## 2024-01-20 DIAGNOSIS — Z0000 Encounter for general adult medical examination without abnormal findings: Secondary | ICD-10-CM | POA: Diagnosis not present

## 2024-01-20 DIAGNOSIS — R911 Solitary pulmonary nodule: Secondary | ICD-10-CM | POA: Diagnosis not present

## 2024-01-20 DIAGNOSIS — D75839 Thrombocytosis, unspecified: Secondary | ICD-10-CM | POA: Diagnosis not present

## 2024-01-20 DIAGNOSIS — E875 Hyperkalemia: Secondary | ICD-10-CM | POA: Diagnosis not present

## 2024-01-20 DIAGNOSIS — M8580 Other specified disorders of bone density and structure, unspecified site: Secondary | ICD-10-CM | POA: Diagnosis not present

## 2024-01-20 DIAGNOSIS — Z23 Encounter for immunization: Secondary | ICD-10-CM | POA: Diagnosis not present

## 2024-01-20 DIAGNOSIS — I10 Essential (primary) hypertension: Secondary | ICD-10-CM | POA: Diagnosis not present

## 2024-01-20 DIAGNOSIS — D509 Iron deficiency anemia, unspecified: Secondary | ICD-10-CM | POA: Diagnosis not present

## 2024-01-20 DIAGNOSIS — E782 Mixed hyperlipidemia: Secondary | ICD-10-CM | POA: Diagnosis not present

## 2024-01-20 DIAGNOSIS — E1169 Type 2 diabetes mellitus with other specified complication: Secondary | ICD-10-CM | POA: Diagnosis not present

## 2024-01-20 DIAGNOSIS — E041 Nontoxic single thyroid nodule: Secondary | ICD-10-CM | POA: Diagnosis not present

## 2024-01-23 ENCOUNTER — Other Ambulatory Visit (HOSPITAL_COMMUNITY): Payer: Self-pay | Admitting: Internal Medicine

## 2024-01-23 DIAGNOSIS — M8580 Other specified disorders of bone density and structure, unspecified site: Secondary | ICD-10-CM

## 2024-01-31 ENCOUNTER — Ambulatory Visit (HOSPITAL_COMMUNITY)
Admission: RE | Admit: 2024-01-31 | Discharge: 2024-01-31 | Disposition: A | Discharge status: Home / Self Care | Source: Ambulatory Visit | Attending: Internal Medicine | Admitting: Internal Medicine

## 2024-01-31 DIAGNOSIS — M8589 Other specified disorders of bone density and structure, multiple sites: Secondary | ICD-10-CM | POA: Diagnosis not present

## 2024-01-31 DIAGNOSIS — Z13820 Encounter for screening for osteoporosis: Secondary | ICD-10-CM | POA: Insufficient documentation

## 2024-01-31 DIAGNOSIS — Z780 Asymptomatic menopausal state: Secondary | ICD-10-CM | POA: Insufficient documentation

## 2024-01-31 DIAGNOSIS — M8580 Other specified disorders of bone density and structure, unspecified site: Secondary | ICD-10-CM | POA: Insufficient documentation

## 2024-02-23 DIAGNOSIS — E113293 Type 2 diabetes mellitus with mild nonproliferative diabetic retinopathy without macular edema, bilateral: Secondary | ICD-10-CM | POA: Diagnosis not present

## 2024-05-15 ENCOUNTER — Encounter: Admitting: Obstetrics and Gynecology

## 2024-10-16 ENCOUNTER — Encounter: Admitting: Obstetrics and Gynecology
